# Patient Record
Sex: Male | Born: 1950 | Race: White | Hispanic: No | Marital: Married | Smoking: Current some day smoker
Health system: Southern US, Community
[De-identification: ages and names within clinical notes are randomized; demographics above are authoritative.]

## PROBLEM LIST (undated history)

## (undated) DIAGNOSIS — E785 Hyperlipidemia, unspecified: Secondary | ICD-10-CM

## (undated) DIAGNOSIS — M1990 Unspecified osteoarthritis, unspecified site: Secondary | ICD-10-CM

## (undated) HISTORY — PX: COLONOSCOPY: SHX174

## (undated) HISTORY — PX: APPENDECTOMY: SHX54

## (undated) HISTORY — PX: VASECTOMY: SHX75

---

## 2004-10-03 ENCOUNTER — Ambulatory Visit: Payer: Self-pay | Admitting: Internal Medicine

## 2006-10-14 ENCOUNTER — Ambulatory Visit: Payer: Self-pay | Admitting: Gastroenterology

## 2006-10-14 LAB — CONVERTED CEMR LAB: PSA: 2.08 ng/mL (ref 0.10–4.00)

## 2007-11-17 ENCOUNTER — Encounter: Payer: Self-pay | Admitting: Internal Medicine

## 2008-01-05 ENCOUNTER — Ambulatory Visit: Payer: Self-pay | Admitting: Internal Medicine

## 2008-01-05 LAB — CONVERTED CEMR LAB
Basophils Relative: 0.6 % (ref 0.0–1.0)
Chloride: 99 meq/L (ref 96–112)
Creatinine, Ser: 1.2 mg/dL (ref 0.4–1.5)
Eosinophils Relative: 2.5 % (ref 0.0–5.0)
Glucose, Bld: 101 mg/dL — ABNORMAL HIGH (ref 70–99)
HCT: 43 % (ref 39.0–52.0)
Ketones, ur: NEGATIVE mg/dL
Neutrophils Relative %: 54.2 % (ref 43.0–77.0)
Nitrite: NEGATIVE
PSA: 2.24 ng/mL (ref 0.10–4.00)
RBC: 4.64 M/uL (ref 4.22–5.81)
RDW: 12.5 % (ref 11.5–14.6)
Sodium: 138 meq/L (ref 135–145)
Urobilinogen, UA: 0.2 (ref 0.0–1.0)
WBC: 6.3 10*3/uL (ref 4.5–10.5)
pH: 5 (ref 5.0–8.0)

## 2008-01-06 ENCOUNTER — Encounter: Payer: Self-pay | Admitting: Urology

## 2008-01-06 ENCOUNTER — Encounter: Payer: Self-pay | Admitting: Internal Medicine

## 2008-01-06 ENCOUNTER — Encounter (INDEPENDENT_AMBULATORY_CARE_PROVIDER_SITE_OTHER): Payer: Self-pay | Admitting: *Deleted

## 2008-01-06 ENCOUNTER — Ambulatory Visit (HOSPITAL_BASED_OUTPATIENT_CLINIC_OR_DEPARTMENT_OTHER): Admission: RE | Admit: 2008-01-06 | Discharge: 2008-01-07 | Payer: Self-pay | Admitting: Urology

## 2008-01-31 ENCOUNTER — Encounter: Payer: Self-pay | Admitting: Internal Medicine

## 2008-11-03 HISTORY — PX: TRANSURETHRAL RESECTION OF PROSTATE: SHX73

## 2009-02-07 ENCOUNTER — Ambulatory Visit: Payer: Self-pay | Admitting: Gastroenterology

## 2009-02-07 LAB — CONVERTED CEMR LAB
BUN: 18 mg/dL (ref 6–23)
CO2: 33 meq/L — ABNORMAL HIGH (ref 19–32)
Chloride: 104 meq/L (ref 96–112)
Creatinine, Ser: 1.3 mg/dL (ref 0.4–1.5)
Hemoglobin, Urine: NEGATIVE
Total Protein, Urine: NEGATIVE mg/dL
Urine Glucose: NEGATIVE mg/dL

## 2009-02-08 ENCOUNTER — Ambulatory Visit (HOSPITAL_COMMUNITY): Admission: RE | Admit: 2009-02-08 | Discharge: 2009-02-08 | Payer: Self-pay | Admitting: Gastroenterology

## 2009-05-25 ENCOUNTER — Ambulatory Visit (HOSPITAL_COMMUNITY): Admission: RE | Admit: 2009-05-25 | Discharge: 2009-05-25 | Payer: Self-pay | Admitting: Internal Medicine

## 2009-07-04 ENCOUNTER — Ambulatory Visit: Payer: Self-pay | Admitting: Gastroenterology

## 2009-07-04 LAB — CONVERTED CEMR LAB: BUN: 19 mg/dL (ref 6–23)

## 2009-10-24 ENCOUNTER — Encounter (INDEPENDENT_AMBULATORY_CARE_PROVIDER_SITE_OTHER): Payer: Self-pay | Admitting: *Deleted

## 2009-10-30 ENCOUNTER — Encounter: Payer: Self-pay | Admitting: Gastroenterology

## 2010-03-11 ENCOUNTER — Encounter: Payer: Self-pay | Admitting: Internal Medicine

## 2010-07-18 ENCOUNTER — Encounter (INDEPENDENT_AMBULATORY_CARE_PROVIDER_SITE_OTHER): Payer: Self-pay | Admitting: *Deleted

## 2010-08-07 ENCOUNTER — Ambulatory Visit: Payer: Self-pay | Admitting: Internal Medicine

## 2010-08-10 ENCOUNTER — Encounter: Payer: Self-pay | Admitting: Internal Medicine

## 2010-10-11 ENCOUNTER — Ambulatory Visit: Payer: Self-pay | Admitting: Internal Medicine

## 2010-10-11 ENCOUNTER — Telehealth: Payer: Self-pay | Admitting: Internal Medicine

## 2010-10-11 DIAGNOSIS — R109 Unspecified abdominal pain: Secondary | ICD-10-CM | POA: Insufficient documentation

## 2010-10-11 LAB — CONVERTED CEMR LAB
Albumin: 4.4 g/dL (ref 3.5–5.2)
BUN: 21 mg/dL (ref 6–23)
Basophils Relative: 0.2 % (ref 0.0–3.0)
Bilirubin Urine: NEGATIVE
CO2: 30 meq/L (ref 19–32)
Calcium: 10 mg/dL (ref 8.4–10.5)
Chloride: 103 meq/L (ref 96–112)
Creatinine, Ser: 1.2 mg/dL (ref 0.4–1.5)
Eosinophils Absolute: 0.1 10*3/uL (ref 0.0–0.7)
Eosinophils Relative: 1.1 % (ref 0.0–5.0)
GFR calc non Af Amer: 65.92 mL/min (ref >60.00)
Glucose, Bld: 115 mg/dL — ABNORMAL HIGH (ref 70–99)
Hemoglobin: 13.6 g/dL (ref 13.0–17.0)
Ketones, ur: NEGATIVE mg/dL
Leukocytes, UA: NEGATIVE
Lipase: 26 units/L (ref 11.0–59.0)
Lymphocytes Relative: 17.9 % (ref 12.0–46.0)
MCHC: 34.3 g/dL (ref 30.0–36.0)
Monocytes Relative: 6.7 % (ref 3.0–12.0)
Neutro Abs: 4.9 10*3/uL (ref 1.4–7.7)
Nitrite: NEGATIVE
RBC: 4.32 M/uL (ref 4.22–5.81)
WBC: 6.6 10*3/uL (ref 4.5–10.5)

## 2010-10-24 ENCOUNTER — Ambulatory Visit: Payer: Self-pay | Admitting: Endocrinology

## 2010-10-24 LAB — CONVERTED CEMR LAB: Hgb A1c MFr Bld: 6 % (ref 4.6–6.5)

## 2010-11-03 HISTORY — PX: TOTAL HIP ARTHROPLASTY: SHX124

## 2010-11-06 ENCOUNTER — Encounter: Payer: Self-pay | Admitting: Endocrinology

## 2010-11-06 ENCOUNTER — Ambulatory Visit
Admission: RE | Admit: 2010-11-06 | Discharge: 2010-11-06 | Payer: Self-pay | Source: Home / Self Care | Attending: Endocrinology | Admitting: Endocrinology

## 2010-11-06 DIAGNOSIS — R7309 Other abnormal glucose: Secondary | ICD-10-CM | POA: Insufficient documentation

## 2010-11-12 LAB — CONVERTED CEMR LAB: Pancreatic Islet Cell Antibody: <5 (ref <5)

## 2010-12-05 NOTE — Miscellaneous (Signed)
Summary: Orders Update  Clinical Lists Changes  Problems: Added new problem of HYPERGLYCEMIA (ICD-790.29) Orders: Added new Test order of T-ICA/Pancreatic Islet Cell Elsie Stain [phone]) - Signed Added new Test order of T-Anti-GAD [phone]) - Signed

## 2010-12-05 NOTE — Miscellaneous (Signed)
Summary: Reminder of Colonoscopy  ---- Converted from flag ---- ---- 03/11/2010 10:59 AM, Louis Meckel MD wrote: thanks for the reminder  ---- 03/11/2010 10:48 AM, Harlow Mares CMA Duncan Dull) wrote: Nathaniel Mejia reminder  Hey just an FYI I am to your name on the recall list again and its time for your Colonoscopy you are over due and you know the importance of having your screening colonoscopy. I am sure Dr. Marina Goodell would be more than willing to work you into his schedule.   Nathaniel Mejia

## 2010-12-05 NOTE — Procedures (Signed)
Summary: Colonoscopy  Patient: Nathaniel Mejia Note: All result statuses are Final unless otherwise noted.  Tests: (1) Colonoscopy (COL)   COL Colonoscopy           DONE     [facility]     [address]     [place], [place]  [address]           COLONOSCOPY PROCEDURE REPORT           PATIENT:  Nathaniel, Mejia  MR#:  [id number]     BIRTHDATE:  07-16-1951, 59 yrs. old  GENDER:  male     ENDOSCOPIST:  Wilhemina Bonito. Eda Keys, MD     REF. BY:  Screening Recall     PROCEDURE DATE:  08/07/2010     PROCEDURE:  Colonoscopy with snare polypectomy x 1     ASA CLASS:  Class II     INDICATIONS:  Elevated Risk Screening, family history of colon     cancer ; father @ 29 (last exam 10-2004 negative)     MEDICATIONS:   Fentanyl 100 mcg IV, Versed 10 mg IV, Benadryl 25     mg IV           DESCRIPTION OF PROCEDURE:   After the risks benefits and     alternatives of the procedure were thoroughly explained, informed     consent was obtained.  Digital rectal exam was performed and     revealed no abnormalities, including NORMAL PROSTATE exam.   The     LB 180AL [id number] endoscope was introduced through the anus and     advanced to the cecum, which was identified by both the appendix     and ileocecal valve, without limitations.Time to cecum = 5:41 min.     The quality of the prep was excellent, using Su-prep.  The     instrument was then slowly withdrawn (time = 17:46 min) as the     colon was fully examined.     <<PROCEDUREIMAGES>>           FINDINGS:  The terminal ileum appeared normal.  A diminutive polyp     was found in the proximal transverse colon. Polyp was snared     without cautery. Retrieval was successful.   This was otherwise a     normal examination of the colon.   Retroflexed views in the rectum     revealed small internal hemorrhoids.    The scope was then     withdrawn from the patient and the procedure completed.           COMPLICATIONS:  None           ENDOSCOPIC  IMPRESSION:     1) Normal terminal ileum     2) Diminutive polyp in the proximal transverse colon - removed     3) Otherwise normal examination     4) Internal hemorrhoids     RECOMMENDATIONS:     1) Follow up colonoscopy in 5 years           ______________________________     Wilhemina Bonito. Eda Keys, MD           CC:  Melvia Heaps, MD           n.     Rosalie Doctor:   Wilhemina Bonito. Eda Keys at 08/07/2010 02:53 PM           Melvia Heaps, [id number]  Note: An exclamation mark (!) indicates  a result that was not dispersed into the flowsheet. Document Creation Date: 08/07/2010 2:54 PM _______________________________________________________________________  (1) Order result status: Final Collection or observation date-time: 08/07/2010 14:43 Requested date-time:  Receipt date-time:  Reported date-time:  Referring Physician:   Ordering Physician: Fransico Setters [phone]) Specimen Source:  Source: Launa Grill Order Number: [id number] Lab site:   Appended Document: Colonoscopy recall 5 yrs     Procedures Next Due Date:    Colonoscopy: 08/2015

## 2010-12-05 NOTE — Progress Notes (Signed)
Summary: phone note: Acute abdominal pain  Phone Note Call from Patient   Caller: Patient Call For: Dr. Marina Goodell Details for Reason: abdominal pain Summary of Call: Nathaniel Mejia called this morning at 7:30 AM with new-onset acute abdominal pain. Some diaphoresis. No vomiting, change of bowel habits, or urinary symptoms. Took one dose of PPI and antispasmodic without relief. Pain rated 8/10. Subsequent gradual improvement and then he took a Vicodin. Pain around 2/10 at 8:20 AM. We will plan to have him come in for CBC, comprehensive metabolic panel, amylase, lipase, and urinalysis. As well, CT scan. Initial call taken by: Hilarie Fredrickson MD,  October 11, 2010 9:08 AM  Follow-up for Phone Call        Patient aware of CT scan scheduled for this am at 11:30.  He is instrcted to begin drinking his contrast first bottle at 9:30 and the second at 10:30.  Patient is advised to come for lab work also. Follow-up by: Darcey Nora RN, CGRN,  October 11, 2010 9:47 AM  New Problems: ABDOMINAL PAIN, ACUTE (ICD-789.00)   New Problems: ABDOMINAL PAIN, ACUTE (ICD-789.00)

## 2010-12-05 NOTE — Letter (Signed)
Summary: [facility] Instructions  [facility]  [address] [place], [place] [address]   Phone: [phone]  Fax: [phone]       Nathaniel Mejia    [birth year]    MRN: [id number]        Procedure Day Dorna Bloom:  Wednesday 08/07/2010     Arrival Time: 12:00 pm      Procedure Time: 1:00 pm     Location of Procedure:                    _ x_  [facility] (4th Floor)                        PREPARATION FOR COLONOSCOPY WITH MOVIPREP   Starting 5 days prior to your procedure 08/02/2010 do not eat nuts, seeds, popcorn, corn, beans, peas,  salads, or any raw vegetables.  Do not take any fiber supplements (e.g. Metamucil, Citrucel, and Benefiber).  THE DAY BEFORE YOUR PROCEDURE         DATE: Tuesday 10/4  1.  Drink clear liquids the entire day-NO SOLID FOOD  2.  Do not drink anything colored red or purple.  Avoid juices with pulp.  No orange juice.  3.  Drink at least 64 oz. (8 glasses) of fluid/clear liquids during the day to prevent dehydration and help the prep work efficiently.  CLEAR LIQUIDS INCLUDE: Water Jello Ice Popsicles Tea (sugar ok, no milk/cream) Powdered fruit flavored drinks Coffee (sugar ok, no milk/cream) Gatorade Juice: apple, white grape, white cranberry  Lemonade Clear bullion, consomm, broth Carbonated beverages (any kind) Strained chicken noodle soup Hard Candy                             4.  In the morning, mix first dose of MoviPrep solution:    Empty 1 Pouch A and 1 Pouch B into the disposable container    Add lukewarm drinking water to the top line of the container. Mix to dissolve    Refrigerate (mixed solution should be used within 24 hrs)  5.  Begin drinking the prep at 5:00 p.m. The MoviPrep container is divided by 4 marks.   Every 15 minutes drink the solution down to the next mark (approximately 8 oz) until the full liter is complete.   6.  Follow completed prep with 16 oz of clear liquid of your choice (Nothing  red or purple).  Continue to drink clear liquids until bedtime.  7.  Before going to bed, mix second dose of MoviPrep solution:    Empty 1 Pouch A and 1 Pouch B into the disposable container    Add lukewarm drinking water to the top line of the container. Mix to dissolve    Refrigerate  THE DAY OF YOUR PROCEDURE      DATE: Wednesday 10/5  Beginning at 8:00 a.m. (5 hours before procedure):         1. Every 15 minutes, drink the solution down to the next mark (approx 8 oz) until the full liter is complete.  2. Follow completed prep with 16 oz. of clear liquid of your choice.    3. You may drink clear liquids until 11:00 am (2 HOURS BEFORE PROCEDURE).   MEDICATION INSTRUCTIONS  Unless otherwise instructed, you should take regular prescription medications with a small sip of water   as early as possible the morning of your  procedure.           OTHER INSTRUCTIONS  You will need a responsible adult at least 60 years of age to accompany you and drive you home.   This person must remain in the waiting room during your procedure.  Wear loose fitting clothing that is easily removed.  Leave jewelry and other valuables at home.  However, you may wish to bring a book to read or  an iPod/MP3 player to listen to music as you wait for your procedure to start.  Remove all body piercing jewelry and leave at home.  Total time from sign-in until discharge is approximately 2-3 hours.  You should go home directly after your procedure and rest.  You can resume normal activities the  day after your procedure.  The day of your procedure you should not:   Drive   Make legal decisions   Operate machinery   Drink alcohol   Return to work  You will receive specific instructions about eating, activities and medications before you leave.    The above instructions have been reviewed and explained to me by   Ezra Sites RN  July 18, 2010 3:13 PM     I fully understand and  can verbalize these instructions _____________________________ Date _________

## 2010-12-05 NOTE — Letter (Signed)
Summary: Patient Notice- Polyp Results  [facility]  [address] [place], [place] [address]   Phone: [phone]  Fax: [phone]        August 10, 2010 MRN: [id number]    Melvia Heaps, MD [address] [place], [place]  [address]    Dear Dr. Arlyce Dice,  I am pleased to inform you that the colon polyp removed during your recent colonoscopy was a benign (no cancer detected)tubular adenoma upon pathologic examination.  I recommend you have a repeat colonoscopy examination in 5 years to look for recurrent polyps, as having colon polyps increases your risk for having recurrent polyps or even colon cancer in the future.  Should you develop new or worsening symptoms of abdominal pain, bowel habit changes or bleeding from the rectum or bowels, please schedule an evaluation with either your primary care physician or with me.     Additional information/recommendations:   __ No further action with gastroenterology is needed at this time. Please      follow-up with your primary care physician for your other healthcare      needs.     Please call us if you are having persistent problems or have questions about your condition that have not been fully answered at this time.  Sincerely,  Roxy Cedar MD  This letter has been electronically signed by your physician.  Appended Document: Patient Notice- Polyp Results letter mailed

## 2011-01-30 ENCOUNTER — Encounter: Payer: Self-pay | Admitting: Endocrinology

## 2011-01-30 LAB — HEMOGLOBIN A1C: Mean Plasma Glucose: 126 mg/dL — ABNORMAL HIGH (ref <117)

## 2011-01-30 LAB — CONVERTED CEMR LAB: Hgb A1c MFr Bld: 6 % — ABNORMAL HIGH (ref <5.7)

## 2011-02-11 ENCOUNTER — Other Ambulatory Visit: Payer: Self-pay | Admitting: *Deleted

## 2011-02-11 MED ORDER — SERTRALINE HCL 100 MG PO TABS: ORAL_TABLET | ORAL | Status: DC

## 2011-02-11 MED ORDER — ATORVASTATIN CALCIUM 20 MG PO TABS: 20.0000 mg | ORAL_TABLET | Freq: Every day | ORAL | Status: DC

## 2011-02-11 NOTE — Telephone Encounter (Signed)
Per Dr [name], refill patient zoloft and lipitor. Rx sent to [facility] as per patient request.

## 2011-02-14 ENCOUNTER — Other Ambulatory Visit: Payer: Self-pay | Admitting: Endocrinology

## 2011-02-14 MED ORDER — ATORVASTATIN CALCIUM 20 MG PO TABS: 20.0000 mg | ORAL_TABLET | Freq: Every day | ORAL | Status: DC

## 2011-02-14 MED ORDER — SERTRALINE HCL 100 MG PO TABS: ORAL_TABLET | ORAL | Status: DC

## 2011-03-18 NOTE — Op Note (Signed)
NAMEHOMER, MILLER               ACCOUNT NO.:  [id number]   MEDICAL RECORD NO.:  [id number]          PATIENT TYPE:  AMB   LOCATION:  [facility]                         FACILITY:  [facility]   PHYSICIAN:  Bertram Millard. Dahlstedt, M.D.DATE OF BIRTH:  [birth year]   DATE OF PROCEDURE:  DATE OF DISCHARGE:                               OPERATIVE REPORT   PREOPERATIVE DIAGNOSIS:  Benign prostatic hypertrophy.   POSTOPERATIVE DIAGNOSIS:  Benign prostatic hypertrophy.   PROCEDURE:  TURP using Gyrus device.   ANESTHESIA:  General LMA.   COMPLICATIONS:  None.   SPECIMENS:  Prostate chips to pathology.   BRIEF HISTORY:  Cleone Slim is a 60 year old who I have been seeing  intermittently with lower urinary tract symptoms.  He has significant  symptoms of slowing of the stream, urgency and frequency.  He has been  on Uroxatral.  Despite this, he has significant symptomatology.  We  talked intermittently about intervention with surgery.  He is interested  in minimally invasive therapy, i.e., laser.  I recommended that we  proceed with an anesthetic procedure, and I have recommended the Gyrus  device.  This offers the same benefits as using the green light laser,  but has a cleaner effect.  It can also be used with traditional  equipment.  Rob understands the procedure and as well as the risks and  complications.  He understands these and desires to proceed.   DESCRIPTION OF PROCEDURE:  The patient was identified in the holding  area, and received preoperative IV antibiotics.  He was taken to the  operating room where general anesthetic was administered using LMA.  He  was placed in the dorsal lithotomy.  Genitalia and perineum were prepped  and draped.  Urethral meatus was calibrated to 32-French with Sissy Hoff  sounds.  The 28-French resectoscope sheath was placed.  The resectoscope  element with the Gyrus mushroom tip was then placed.  The bladder was  inspected and found be normal except for 1-2+  trabeculations.  No  foreign bodies were seen.  The ureteral orifices were identified, and  were a fair distance from the bladder neck.  There was a fair median  lobe as well as some anterior significant anterior obstructing tissue.  Using the Gyrus from the bladder neck to the verumontanum, I left the  verumontanum intact to use as a landmark for distal resection.  This  resection/vaporization of the posterior median lobe was done deep enough  to get to the circular muscular fibers.  I then turned the scope  anteriorly and vaporized the anterior tissue from the bladder neck all  the way to the area of the verumontanum/apex of the prostate.  Again,  vaporization was taken down to the circular fibers.  I then used the  same procedure to vaporize the left lateral lobe.  I aggressively  resected down, again, to the circular fibers/surgical capsule.  With the  right lobe, I used the cutting loop to obtain approximately 5-6 chips of  tissue.  These will be sent for pathology.  They were allowed to float  into the prostate for later  evacuation.  I then replaced the cutting  loop with the mushroom tip.  The right lobe was then vaporized down to  the fibers/surgical capsule.  I then used the same mushroom tip to  cauterize all of the all of the resected prostate circumferentially.  Once this was done, I incised the bladder neck in the midline with the  mushroom tip.  This allowed the bladder neck to open quite nicely.  Care  was taken to identify the ureteral orifices before I did this - they  were well away from the bladder neck area.  The mushroom tip was then  used to resect the bladder neck circumferentially.  I then turned the  irrigation off.  No active bleeding was seen.  At this point the chips  were evacuated from the bladder and sent as prostate chips.  They were  sent to pathology.  The bladder was drained, the scope removed and then  a Foley catheter inserted - a 20-French Foley  catheter was used with 20  mL of saline placed in the balloon.  This was hooked to dependent  drainage and secured to the patient's right thigh.   The patient tolerated procedure well.  He was awakened and taken to the  PACU in stable condition.      Bertram Millard. Dahlstedt, M.D.  Electronically Signed     SMD/MEDQ  D:  01/06/2008  T:  01/06/2008  Job:  [id number]   cc:   Titus Dubin. Alwyn Ren, MD,FACP,FCCP  [id number] [place]  [place] [address]

## 2011-04-29 ENCOUNTER — Other Ambulatory Visit: Payer: Self-pay | Admitting: Endocrinology

## 2011-04-29 MED ORDER — CELECOXIB 200 MG PO CAPS: 200.0000 mg | ORAL_CAPSULE | Freq: Every day | ORAL | Status: DC

## 2011-05-12 ENCOUNTER — Other Ambulatory Visit: Payer: Self-pay

## 2011-05-12 MED ORDER — SERTRALINE HCL 100 MG PO TABS: ORAL_TABLET | ORAL | Status: DC

## 2011-05-20 ENCOUNTER — Other Ambulatory Visit (INDEPENDENT_AMBULATORY_CARE_PROVIDER_SITE_OTHER): Payer: Self-pay

## 2011-05-20 ENCOUNTER — Telehealth: Payer: Self-pay | Admitting: Endocrinology

## 2011-05-20 ENCOUNTER — Telehealth: Payer: Self-pay | Admitting: *Deleted

## 2011-05-20 DIAGNOSIS — M791 Myalgia, unspecified site: Secondary | ICD-10-CM

## 2011-05-20 DIAGNOSIS — IMO0001 Reserved for inherently not codable concepts without codable children: Secondary | ICD-10-CM

## 2011-05-20 LAB — COMPREHENSIVE METABOLIC PANEL
ALT: 23 U/L (ref 0–53)
AST: 25 U/L (ref 0–37)
Alkaline Phosphatase: 68 U/L (ref 39–117)
BUN: 30 mg/dL — ABNORMAL HIGH (ref 6–23)
Creatinine, Ser: 1.3 mg/dL (ref 0.4–1.5)
Potassium: 5.3 mEq/L — ABNORMAL HIGH (ref 3.5–5.1)

## 2011-05-20 LAB — CK: Total CK: 94 U/L (ref 7–232)

## 2011-05-20 LAB — SEDIMENTATION RATE: Sed Rate: 17 mm/hr (ref 0–22)

## 2011-05-20 MED ORDER — PREDNISONE 5 MG PO TABS: ORAL_TABLET | ORAL | Status: DC

## 2011-05-20 MED ORDER — METAXALONE 800 MG PO TABS: 800.0000 mg | ORAL_TABLET | Freq: Three times a day (TID) | ORAL | Status: DC

## 2011-05-20 NOTE — Telephone Encounter (Signed)
Lab called requesting change of lab order to future

## 2011-05-20 NOTE — Telephone Encounter (Signed)
Pt reports he recently saw ortho for back sxs, but he is unable to reach that dr now,  He reports ongoing myalgias of the legs, and assoc weakness. i refilled meds, and ordered labs

## 2011-05-27 ENCOUNTER — Other Ambulatory Visit (HOSPITAL_COMMUNITY): Payer: Self-pay | Admitting: Orthopedic Surgery

## 2011-05-27 ENCOUNTER — Ambulatory Visit (HOSPITAL_COMMUNITY)
Admission: RE | Admit: 2011-05-27 | Discharge: 2011-05-27 | Disposition: A | Discharge status: Home / Self Care | Payer: 59 | Source: Ambulatory Visit | Attending: Orthopedic Surgery | Admitting: Orthopedic Surgery

## 2011-05-27 DIAGNOSIS — M47817 Spondylosis without myelopathy or radiculopathy, lumbosacral region: Secondary | ICD-10-CM | POA: Insufficient documentation

## 2011-05-27 DIAGNOSIS — M79609 Pain in unspecified limb: Secondary | ICD-10-CM | POA: Insufficient documentation

## 2011-05-27 DIAGNOSIS — M5137 Other intervertebral disc degeneration, lumbosacral region: Secondary | ICD-10-CM | POA: Insufficient documentation

## 2011-05-27 DIAGNOSIS — M545 Low back pain, unspecified: Secondary | ICD-10-CM

## 2011-05-27 DIAGNOSIS — M51379 Other intervertebral disc degeneration, lumbosacral region without mention of lumbar back pain or lower extremity pain: Secondary | ICD-10-CM | POA: Insufficient documentation

## 2011-05-27 DIAGNOSIS — M48061 Spinal stenosis, lumbar region without neurogenic claudication: Secondary | ICD-10-CM | POA: Insufficient documentation

## 2011-06-02 ENCOUNTER — Other Ambulatory Visit (INDEPENDENT_AMBULATORY_CARE_PROVIDER_SITE_OTHER): Payer: Commercial Managed Care - PPO

## 2011-06-02 ENCOUNTER — Other Ambulatory Visit: Payer: Self-pay | Admitting: *Deleted

## 2011-06-02 DIAGNOSIS — R319 Hematuria, unspecified: Secondary | ICD-10-CM

## 2011-06-02 LAB — BASIC METABOLIC PANEL
BUN: 23 mg/dL (ref 6–23)
Chloride: 105 mEq/L (ref 96–112)
Creatinine, Ser: 1.1 mg/dL (ref 0.4–1.5)
Glucose, Bld: 91 mg/dL (ref 70–99)
Potassium: 4.1 mEq/L (ref 3.5–5.1)

## 2011-06-02 LAB — URINALYSIS, ROUTINE W REFLEX MICROSCOPIC
Bilirubin Urine: NEGATIVE
Ketones, ur: NEGATIVE
Leukocytes, UA: NEGATIVE
Nitrite: NEGATIVE
Specific Gravity, Urine: 1.02 (ref 1.000–1.030)
Urobilinogen, UA: 0.2 (ref 0.0–1.0)
pH: 6 (ref 5.0–8.0)

## 2011-06-04 ENCOUNTER — Other Ambulatory Visit (HOSPITAL_COMMUNITY): Payer: Self-pay | Admitting: Orthopedic Surgery

## 2011-06-04 ENCOUNTER — Ambulatory Visit (HOSPITAL_COMMUNITY): Payer: Commercial Managed Care - PPO

## 2011-06-04 ENCOUNTER — Ambulatory Visit
Admission: RE | Admit: 2011-06-04 | Discharge: 2011-06-04 | Disposition: A | Discharge status: Home / Self Care | Payer: Commercial Managed Care - PPO | Source: Ambulatory Visit | Attending: Orthopedic Surgery | Admitting: Orthopedic Surgery

## 2011-06-04 ENCOUNTER — Ambulatory Visit (HOSPITAL_COMMUNITY)
Admission: RE | Admit: 2011-06-04 | Discharge: 2011-06-04 | Disposition: A | Discharge status: Home / Self Care | Payer: 59 | Source: Ambulatory Visit | Attending: Orthopedic Surgery | Admitting: Orthopedic Surgery

## 2011-06-04 DIAGNOSIS — M47817 Spondylosis without myelopathy or radiculopathy, lumbosacral region: Secondary | ICD-10-CM | POA: Insufficient documentation

## 2011-06-04 DIAGNOSIS — M545 Low back pain: Secondary | ICD-10-CM

## 2011-06-04 MED ORDER — IOHEXOL 180 MG/ML  SOLN
20.0000 mL | Freq: Once | INTRAMUSCULAR | Status: AC | PRN
  Administered 2011-06-04: 5 mL

## 2011-06-16 ENCOUNTER — Other Ambulatory Visit (HOSPITAL_COMMUNITY): Payer: Self-pay | Admitting: Orthopedic Surgery

## 2011-06-16 ENCOUNTER — Ambulatory Visit (HOSPITAL_COMMUNITY)
Admission: RE | Admit: 2011-06-16 | Discharge: 2011-06-16 | Disposition: A | Discharge status: Home / Self Care | Payer: 59 | Source: Ambulatory Visit | Attending: Orthopedic Surgery | Admitting: Orthopedic Surgery

## 2011-06-16 DIAGNOSIS — M47817 Spondylosis without myelopathy or radiculopathy, lumbosacral region: Secondary | ICD-10-CM | POA: Insufficient documentation

## 2011-06-16 MED ORDER — IOHEXOL 180 MG/ML  SOLN: 20.0000 mL | Freq: Once | INTRAMUSCULAR | Status: AC | PRN

## 2011-06-18 ENCOUNTER — Other Ambulatory Visit: Payer: Self-pay | Admitting: Orthopedic Surgery

## 2011-06-18 DIAGNOSIS — M25559 Pain in unspecified hip: Secondary | ICD-10-CM

## 2011-06-19 ENCOUNTER — Inpatient Hospital Stay: Admission: RE | Admit: 2011-06-19 | Payer: Commercial Managed Care - PPO | Source: Ambulatory Visit

## 2011-06-20 ENCOUNTER — Other Ambulatory Visit: Payer: Commercial Managed Care - PPO

## 2011-06-26 ENCOUNTER — Ambulatory Visit
Admission: RE | Admit: 2011-06-26 | Discharge: 2011-06-26 | Disposition: A | Discharge status: Home / Self Care | Payer: Commercial Managed Care - PPO | Source: Ambulatory Visit | Attending: Orthopedic Surgery | Admitting: Orthopedic Surgery

## 2011-06-26 ENCOUNTER — Other Ambulatory Visit: Payer: Self-pay | Admitting: Neurological Surgery

## 2011-06-26 DIAGNOSIS — M25559 Pain in unspecified hip: Secondary | ICD-10-CM

## 2011-06-26 MED ORDER — IOHEXOL 180 MG/ML  SOLN
1.0000 mL | Freq: Once | INTRAMUSCULAR | Status: AC | PRN
  Administered 2011-06-26: 1 mL via EPIDURAL

## 2011-06-26 MED ORDER — METHYLPREDNISOLONE ACETATE 40 MG/ML INJ SUSP (RADIOLOG
120.0000 mg | Freq: Once | INTRAMUSCULAR | Status: AC
  Administered 2011-06-26: 120 mg via EPIDURAL

## 2011-07-14 ENCOUNTER — Other Ambulatory Visit: Payer: Self-pay | Admitting: Orthopedic Surgery

## 2011-07-14 DIAGNOSIS — M25551 Pain in right hip: Secondary | ICD-10-CM

## 2011-07-15 ENCOUNTER — Ambulatory Visit
Admission: RE | Admit: 2011-07-15 | Discharge: 2011-07-15 | Disposition: A | Discharge status: Home / Self Care | Payer: Commercial Managed Care - PPO | Source: Ambulatory Visit | Attending: Orthopedic Surgery | Admitting: Orthopedic Surgery

## 2011-07-15 DIAGNOSIS — M25551 Pain in right hip: Secondary | ICD-10-CM

## 2011-07-15 MED ORDER — IOHEXOL 180 MG/ML  SOLN
1.0000 mL | Freq: Once | INTRAMUSCULAR | Status: AC | PRN
  Administered 2011-07-15: 1 mL via INTRA_ARTICULAR

## 2011-07-15 MED ORDER — METHYLPREDNISOLONE ACETATE 40 MG/ML INJ SUSP (RADIOLOG
120.0000 mg | Freq: Once | INTRAMUSCULAR | Status: AC
  Administered 2011-07-15: 120 mg via INTRA_ARTICULAR

## 2011-07-16 ENCOUNTER — Emergency Department (HOSPITAL_COMMUNITY)
Admission: EM | Admit: 2011-07-16 | Discharge: 2011-07-16 | Disposition: A | Discharge status: Home / Self Care | Payer: Commercial Managed Care - PPO | Attending: Emergency Medicine | Admitting: Emergency Medicine

## 2011-07-16 DIAGNOSIS — E7800 Pure hypercholesterolemia, unspecified: Secondary | ICD-10-CM | POA: Insufficient documentation

## 2011-07-16 DIAGNOSIS — Z79899 Other long term (current) drug therapy: Secondary | ICD-10-CM | POA: Insufficient documentation

## 2011-07-16 DIAGNOSIS — M25559 Pain in unspecified hip: Secondary | ICD-10-CM | POA: Insufficient documentation

## 2011-07-16 DIAGNOSIS — F329 Major depressive disorder, single episode, unspecified: Secondary | ICD-10-CM | POA: Insufficient documentation

## 2011-07-16 DIAGNOSIS — Z9889 Other specified postprocedural states: Secondary | ICD-10-CM | POA: Insufficient documentation

## 2011-07-16 DIAGNOSIS — F3289 Other specified depressive episodes: Secondary | ICD-10-CM | POA: Insufficient documentation

## 2011-07-16 LAB — CBC
HCT: 41 % (ref 39.0–52.0)
MCH: 30.8 pg (ref 26.0–34.0)
MCHC: 34.1 g/dL (ref 30.0–36.0)
MCV: 90.3 fL (ref 78.0–100.0)
Platelets: 253 10*3/uL (ref 150–400)
RDW: 12.6 % (ref 11.5–15.5)

## 2011-07-16 LAB — BASIC METABOLIC PANEL
BUN: 26 mg/dL — ABNORMAL HIGH (ref 6–23)
Calcium: 10.8 mg/dL — ABNORMAL HIGH (ref 8.4–10.5)
Creatinine, Ser: 1.26 mg/dL (ref 0.50–1.35)
GFR calc Af Amer: >60 mL/min (ref >60)
GFR calc non Af Amer: 59 mL/min — ABNORMAL LOW (ref >60)

## 2011-07-21 ENCOUNTER — Other Ambulatory Visit (HOSPITAL_COMMUNITY): Payer: Self-pay | Admitting: Orthopedic Surgery

## 2011-07-21 DIAGNOSIS — M25551 Pain in right hip: Secondary | ICD-10-CM

## 2011-07-22 ENCOUNTER — Ambulatory Visit (HOSPITAL_COMMUNITY)
Admission: RE | Admit: 2011-07-22 | Discharge: 2011-07-22 | Disposition: A | Discharge status: Home / Self Care | Payer: Commercial Managed Care - PPO | Source: Ambulatory Visit | Attending: Orthopedic Surgery | Admitting: Orthopedic Surgery

## 2011-07-22 DIAGNOSIS — M1610 Unilateral primary osteoarthritis, unspecified hip: Secondary | ICD-10-CM | POA: Insufficient documentation

## 2011-07-22 DIAGNOSIS — M25551 Pain in right hip: Secondary | ICD-10-CM

## 2011-07-22 DIAGNOSIS — M169 Osteoarthritis of hip, unspecified: Secondary | ICD-10-CM | POA: Insufficient documentation

## 2011-07-22 DIAGNOSIS — M25559 Pain in unspecified hip: Secondary | ICD-10-CM | POA: Insufficient documentation

## 2011-09-09 ENCOUNTER — Ambulatory Visit (INDEPENDENT_AMBULATORY_CARE_PROVIDER_SITE_OTHER): Payer: 59 | Admitting: Internal Medicine

## 2011-09-09 ENCOUNTER — Encounter: Payer: Self-pay | Admitting: Internal Medicine

## 2011-09-09 VITALS — BP 124/80 | HR 54 | Temp 97.8°F | Resp 12 | Ht 72.0 in | Wt 179.4 lb

## 2011-09-09 DIAGNOSIS — R7309 Other abnormal glucose: Secondary | ICD-10-CM

## 2011-09-09 DIAGNOSIS — Z01818 Encounter for other preprocedural examination: Secondary | ICD-10-CM

## 2011-09-09 DIAGNOSIS — E785 Hyperlipidemia, unspecified: Secondary | ICD-10-CM

## 2011-09-09 NOTE — Progress Notes (Signed)
Subjective:    Patient ID: Nathaniel Mejia, male    DOB: 09-10-1951, 60 y.o.   MRN: [id number]  HPI Pre op evaluation: Surgical diagnosis:advanced DJD Tentative surgical date/Surgeon: 10/21/2011 for  bilateral THA w/wo autograph/allograft, Anterior Approach/ Dr Lajoyce Corners Severity: up to 10 on occasion, typically up to 8 Course: R hip initially but subsequently in L as well  Activity of daily living limitation/impairment of function:he could not stand for procedures in GI Lab  & limping Treatment to date, efficacy: S/P 3 ESI injections; nerve root steroid injection;R hip intra -articular steroids X 1  Significant past medical history: no Diabetes but FBS elevated on steroids with A1c of 6 %. A nonfasting glucose was 191 on 07/16/2011. This was performed in the emergency room while on IV D5W. This was in context of intractable hip pain following injection. Nerve block was immediately  beneficial PMH of   Dyslipidemia;on Lipitor X 10 years. No PMH of  Hypertension,coronary disease, or cancer . :     Review of Systems  He had fasting labs done at [facility] @ Doctor[facility] Day; these will be reviewed if possible. He denies myalgias or other adverse effects from the statin.  He exercises at a high cardiac level without symptoms; he gets his heart rate to 170 on stationary bike & ellipitical machine  He has no persistent cough or dyspnea.  Objective:   Physical Exam Gen.: Thin but healthy and  well-nourished in appearance. Alert, appropriate and cooperative throughout exam. Head: Normocephalic without obvious abnormalities. Beard & moustache.  No  alopecia  Eyes: No corneal or conjunctival inflammation noted. Pupils equal round reactive to light and accommodation. Fundal exam is benign without hemorrhages, exudate, papilledema.  Ears: External  ear exam reveals no significant lesions or deformities. Canals clear .TMs normal. Hearing is grossly normal bilaterally. Nose: External nasal exam reveals no deformity or inflammation. Nasal mucosa are pink and moist. No lesions or exudates noted. Septum not deviated  Mouth: Oral mucosa and oropharynx reveal no lesions or exudates. Teeth in good repair. Neck: No deformities, masses, or tenderness noted. Range of motion excellent Thyroid normal Lungs: Normal respiratory effort; chest expands symmetrically. Lungs are clear to auscultation without rales, wheezes, or increased work of breathing. Heart: Normal rate and rhythm. accentuated S1 ; normal S2. No gallop, click, or rub. No murmur. Abdomen: Bowel sounds normal; abdomen soft and nontender. No masses, organomegaly or hernias noted. Genitalia: Dr Retta Diones   .                                                                                   Musculoskeletal/extremities: Increased AP chest diameter. No clubbing, cyanosis, edema, or deformity noted. Range of motion limited @ hips due to pain .Tone & strength  normal.Joints normal. Nail health  good. Vascular: Carotid, radial artery, dorsalis pedis and  posterior tibial pulses are full and equal. No bruits present. Neurologic: Alert and oriented x3. Deep tendon reflexes symmetrical and normal.          Skin: Intact without suspicious lesions or rashes. Lymph: No cervical, axillary lymphadenopathy present. Psych: Mood and affect are normal. 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 By history HDL has been markedly elevated which should be protective.  #3 hyperglycemia, recent in the context of steroid therapy of #1. A1c is  not elevated  Plan: No contraindication to the tentative surgery. Glucose monitor is appropriate; no intervention is expected to be needed  EKG reveals a sinus bradycardia. This is expected with his high level cardiovascular exercise. Early repolarization changes but  no ischemic changes are present.

## 2011-09-09 NOTE — Patient Instructions (Signed)
Risk of premature heart attack or stroke increases as LDL or BAD cholesterol rises.Advanced cholesterol panels optimally determine risk based on particle composition ( NMR Lipoprofile ) or by assessing multiple other genetic risks(Boston Heart Panel 1304X). These are indicated when LDL is > 130, especially if there is family history of heart attack in males before 75 or women before 47

## 2011-09-15 ENCOUNTER — Other Ambulatory Visit: Payer: Self-pay | Admitting: Internal Medicine

## 2011-09-15 ENCOUNTER — Other Ambulatory Visit (INDEPENDENT_AMBULATORY_CARE_PROVIDER_SITE_OTHER): Payer: 59

## 2011-09-15 DIAGNOSIS — R7309 Other abnormal glucose: Secondary | ICD-10-CM

## 2011-09-16 LAB — HEMOGLOBIN A1C: Hgb A1c MFr Bld: 6 % (ref 4.6–6.5)

## 2011-10-15 ENCOUNTER — Encounter (HOSPITAL_COMMUNITY): Payer: Self-pay | Admitting: Pharmacy Technician

## 2011-10-17 ENCOUNTER — Encounter (HOSPITAL_COMMUNITY)
Admission: RE | Admit: 2011-10-17 | Discharge: 2011-10-17 | Disposition: A | Discharge status: Home / Self Care | Payer: 59 | Source: Ambulatory Visit | Attending: Orthopedic Surgery | Admitting: Orthopedic Surgery

## 2011-10-17 ENCOUNTER — Encounter (HOSPITAL_COMMUNITY): Payer: Self-pay

## 2011-10-17 HISTORY — DX: Unspecified osteoarthritis, unspecified site: M19.90

## 2011-10-17 HISTORY — DX: Hyperlipidemia, unspecified: E78.5

## 2011-10-17 LAB — URINALYSIS, ROUTINE W REFLEX MICROSCOPIC
Bilirubin Urine: NEGATIVE
Glucose, UA: NEGATIVE mg/dL
Hgb urine dipstick: NEGATIVE
Specific Gravity, Urine: 1.025 (ref 1.005–1.030)
Urobilinogen, UA: 0.2 mg/dL (ref 0.0–1.0)

## 2011-10-17 LAB — BASIC METABOLIC PANEL
CO2: 32 mEq/L (ref 19–32)
Calcium: 10.9 mg/dL — ABNORMAL HIGH (ref 8.4–10.5)
GFR calc non Af Amer: 59 mL/min — ABNORMAL LOW (ref >90)
Sodium: 140 mEq/L (ref 135–145)

## 2011-10-17 LAB — DIFFERENTIAL
Lymphocytes Relative: 34 % (ref 12–46)
Lymphs Abs: 2.4 10*3/uL (ref 0.7–4.0)
Neutro Abs: 3.8 10*3/uL (ref 1.7–7.7)
Neutrophils Relative %: 55 % (ref 43–77)

## 2011-10-17 LAB — CBC
Platelets: 271 10*3/uL (ref 150–400)
RBC: 4.45 MIL/uL (ref 4.22–5.81)
WBC: 7 10*3/uL (ref 4.0–10.5)

## 2011-10-17 LAB — SURGICAL PCR SCREEN: MRSA, PCR: NEGATIVE

## 2011-10-17 LAB — URINE MICROSCOPIC-ADD ON

## 2011-10-17 LAB — PROTIME-INR: Prothrombin Time: 13.6 seconds (ref 11.6–15.2)

## 2011-10-17 LAB — APTT: aPTT: 32 seconds (ref 24–37)

## 2011-10-17 MED ORDER — CHLORHEXIDINE GLUCONATE 4 % EX LIQD: 60.0000 mL | Freq: Once | CUTANEOUS | Status: DC

## 2011-10-17 NOTE — Patient Instructions (Signed)
20 Nathaniel Mejia  10/17/2011   Your procedure is scheduled on: Tuesday 12/18 at 8:45 am  Report to [facility] at 6:30 AM.  Call this number if you have problems the morning of surgery: [phone]   Remember:   Do not eat food or drink anything after midnight the night before your surgery    Take these medicines the morning of surgery with A SIP OF WATER: lipitor and zofloft   Do not wear jewelry, make-up or nail polish.  Do not wear lotions, powders, or perfumes. You may wear deodorant.  Do not shave 48 hours prior to surgery.  Do not bring valuables to the hospital.  Contacts, dentures or bridgework may not be worn into surgery.  Leave suitcase in the car. After surgery it may be brought to your room.  For patients admitted to the hospital, checkout time is 11:00 AM the day of discharge.   Patients discharged the day of surgery will not be allowed to drive home.    Special Instructions: CHG Shower Use Special Wash: 1/2 bottle night before surgery and 1/2 bottle morning of surgery.   Please read over the following fact sheets that you were given: Blood Transfusion Information and MRSA Information And incentive spirometer instructions

## 2011-10-17 NOTE — Pre-Procedure Instructions (Signed)
ekg report on chart -from dr. Alwyn Ren --done 09/09/11 cxr not needed preop - per anesthesiologist's guidelines

## 2011-10-19 NOTE — H&P (Signed)
Nathaniel Mejia is an 60 y.o. male.    Chief Complaint: bilateral Hip OA and pain   HPI: Pt is a 60 y.o. male complaining of bilateral hip pain for about a year. Pain had continually increased since the beginning. He originally thought the pain was coming from his back, after being worked up for back issues the pain eventually settled into the hips. X-rays in the clinic show end-stage arthritic changes of bilateral hips. Pt has tried various conservative treatments which have failed to alleviate their symptoms. Various options are discussed with the patient. Risks, benefits and expectations were discussed with the patient. Patient understand the risks, benefits and expectations and wishes to proceed with surgery.   PCP:  No primary provider on file.  D/C Plans: Home with HHPT  Post-op Meds:  Rx given for ASA, Robaxin, Celebrex, Iron, Colace and MiraLax  Tranexamic Acid: To be given  PMH: Past Medical History  Diagnosis Date  . Arthritis     bilateral hip oa and pain  . Hyperlipidemia     PSH: Past Surgical History  Procedure Date  . Transurethral resection of prostate 2010    Dr Retta Diones  . Appendectomy   . Vasectomy   . Colonoscopy     adenomatous polyp 2012    Social History:  reports that he has been smoking Pipe.  He does not have any smokeless tobacco history on file. He reports that he drinks alcohol. He reports that he does not use illicit drugs.  Allergies:  No Known Allergies  Medications: No current facility-administered medications on file as of .   Medications Prior to Admission  Medication Sig Dispense Refill  . atorvastatin (LIPITOR) 20 MG tablet Take 20 mg by mouth every morning.       . sertraline (ZOLOFT) 100 MG tablet Take 150 mg by mouth every morning. Take 1 1/2  tablets by mouth once daily        ROS: Review of Systems  Constitutional: Negative.   HENT: Negative.   Eyes: Negative.   Respiratory: Negative.   Cardiovascular: Negative.     Gastrointestinal: Negative.   Genitourinary: Negative.   Musculoskeletal: Positive for joint pain.  Skin: Negative.   Neurological: Negative.   Endo/Heme/Allergies: Negative.   Psychiatric/Behavioral: Negative.      Physican Exam: BP : 136/80 ; Pulse : 58 ; Resp : 14; Physical Exam  Constitutional: He is oriented to person, place, and time and well-developed, well-nourished, and in no distress.  HENT:  Head: Normocephalic and atraumatic.  Nose: Nose normal.  Mouth/Throat: Oropharynx is clear and moist.  Eyes: Conjunctivae and EOM are normal. Pupils are equal, round, and reactive to light.  Neck: Neck supple. No JVD present. No tracheal deviation present. No thyromegaly present.  Cardiovascular: Normal rate, regular rhythm and normal heart sounds.  Exam reveals no gallop and no friction rub.   No murmur heard. Pulmonary/Chest: Effort normal and breath sounds normal. No stridor.  Abdominal: Soft. There is no tenderness. There is no guarding.  Musculoskeletal:       Right hip: He exhibits decreased range of motion and bony tenderness. He exhibits normal strength and no swelling.       Left hip: He exhibits decreased range of motion and bony tenderness. He exhibits normal strength and no swelling.  Lymphadenopathy:    He has no cervical adenopathy.  Neurological: He is alert and oriented to person, place, and time.  Skin: Skin is warm and dry. No rash noted. No  erythema. No pallor.  Psychiatric: Affect normal.     Assessment/Plan Assessment: bilateral Hip OA and pain   Plan: Patient will undergo bilateral total hip arthroplasties, anterior approaches on 10/21/2011. Risks benefits and expectation were discussed with the patient. Patient understand risks, benefits and expectation and wishes to proceed.   Anastasio Auerbach Louie Flenner   PAC  10/19/2011, 7:04 PM

## 2011-10-20 MED ORDER — TRANEXAMIC ACID 100 MG/ML IV SOLN
770.0000 mg | Freq: Once | INTRAVENOUS | Status: DC
  Filled 2011-10-20: qty 7.7

## 2011-10-21 ENCOUNTER — Encounter (HOSPITAL_COMMUNITY): Payer: Self-pay | Admitting: Anesthesiology

## 2011-10-21 ENCOUNTER — Inpatient Hospital Stay (HOSPITAL_COMMUNITY): Payer: 59 | Admitting: Anesthesiology

## 2011-10-21 ENCOUNTER — Encounter (HOSPITAL_COMMUNITY)
Admission: RE | Disposition: A | Discharge status: Home Health | Payer: Self-pay | Source: Ambulatory Visit | Attending: Orthopedic Surgery

## 2011-10-21 ENCOUNTER — Inpatient Hospital Stay (HOSPITAL_COMMUNITY): Payer: 59

## 2011-10-21 ENCOUNTER — Inpatient Hospital Stay (HOSPITAL_COMMUNITY)
Admission: RE | Admit: 2011-10-21 | Discharge: 2011-10-24 | DRG: 462 | Disposition: A | Discharge status: Home Health | Payer: 59 | Source: Ambulatory Visit | Attending: Orthopedic Surgery | Admitting: Orthopedic Surgery

## 2011-10-21 ENCOUNTER — Encounter (HOSPITAL_COMMUNITY): Payer: Self-pay

## 2011-10-21 DIAGNOSIS — F17200 Nicotine dependence, unspecified, uncomplicated: Secondary | ICD-10-CM | POA: Diagnosis present

## 2011-10-21 DIAGNOSIS — Z96643 Presence of artificial hip joint, bilateral: Secondary | ICD-10-CM

## 2011-10-21 DIAGNOSIS — D62 Acute posthemorrhagic anemia: Secondary | ICD-10-CM | POA: Diagnosis not present

## 2011-10-21 DIAGNOSIS — E785 Hyperlipidemia, unspecified: Secondary | ICD-10-CM | POA: Diagnosis present

## 2011-10-21 DIAGNOSIS — Z01812 Encounter for preprocedural laboratory examination: Secondary | ICD-10-CM

## 2011-10-21 DIAGNOSIS — M1610 Unilateral primary osteoarthritis, unspecified hip: Principal | ICD-10-CM | POA: Diagnosis present

## 2011-10-21 DIAGNOSIS — M169 Osteoarthritis of hip, unspecified: Principal | ICD-10-CM | POA: Diagnosis present

## 2011-10-21 LAB — HEMOGLOBIN AND HEMATOCRIT, BLOOD: Hemoglobin: 8.9 g/dL — ABNORMAL LOW (ref 13.0–17.0)

## 2011-10-21 SURGERY — ARTHROPLASTY, HIP, BILATERAL, TOTAL, ANTERIOR APPROACH
Anesthesia: Epidural | Site: Hip | Laterality: Bilateral | Wound class: Clean

## 2011-10-21 MED ORDER — NALOXONE HCL 0.4 MG/ML IJ SOLN: 0.4000 mg | INTRAMUSCULAR | Status: DC | PRN

## 2011-10-21 MED ORDER — EPHEDRINE SULFATE 50 MG/ML IJ SOLN
5.0000 mg | INTRAMUSCULAR | Status: DC | PRN
  Administered 2011-10-21 (×8): 5 mg via INTRAVENOUS

## 2011-10-21 MED ORDER — PROPOFOL 10 MG/ML IV EMUL
INTRAVENOUS | Status: DC | PRN
  Administered 2011-10-21: 30 mg via INTRAVENOUS
  Administered 2011-10-21: 20 mg via INTRAVENOUS

## 2011-10-21 MED ORDER — BISACODYL 5 MG PO TBEC: 5.0000 mg | DELAYED_RELEASE_TABLET | Freq: Every day | ORAL | Status: DC | PRN

## 2011-10-21 MED ORDER — DIPHENHYDRAMINE HCL 25 MG PO CAPS: 25.0000 mg | ORAL_CAPSULE | Freq: Four times a day (QID) | ORAL | Status: DC | PRN

## 2011-10-21 MED ORDER — EPHEDRINE SULFATE 50 MG/ML IJ SOLN
INTRAMUSCULAR | Status: AC
  Administered 2011-10-21: 5 mg via INTRAVENOUS
  Filled 2011-10-21: qty 1

## 2011-10-21 MED ORDER — ONDANSETRON HCL 4 MG/2ML IJ SOLN
INTRAMUSCULAR | Status: AC
  Administered 2011-10-21: 4 mg
  Filled 2011-10-21: qty 2

## 2011-10-21 MED ORDER — DEXAMETHASONE SODIUM PHOSPHATE 10 MG/ML IJ SOLN: 10.0000 mg | Freq: Once | INTRAMUSCULAR | Status: DC

## 2011-10-21 MED ORDER — ACETAMINOPHEN 650 MG RE SUPP: 650.0000 mg | Freq: Four times a day (QID) | RECTAL | Status: DC | PRN

## 2011-10-21 MED ORDER — SERTRALINE HCL 50 MG PO TABS
150.0000 mg | ORAL_TABLET | Freq: Every day | ORAL | Status: DC
  Administered 2011-10-22 – 2011-10-24 (×3): 150 mg via ORAL
  Filled 2011-10-21 (×4): qty 1

## 2011-10-21 MED ORDER — LACTATED RINGERS IV SOLN
INTRAVENOUS | Status: DC | PRN
  Administered 2011-10-21 (×4): via INTRAVENOUS

## 2011-10-21 MED ORDER — METHOCARBAMOL 100 MG/ML IJ SOLN
500.0000 mg | Freq: Four times a day (QID) | INTRAVENOUS | Status: DC | PRN
  Administered 2011-10-21 – 2011-10-22 (×3): 500 mg via INTRAVENOUS
  Filled 2011-10-21 (×3): qty 5

## 2011-10-21 MED ORDER — KETOROLAC TROMETHAMINE 60 MG/2ML IM SOLN: 60.0000 mg | Freq: Once | INTRAMUSCULAR | Status: AC | PRN

## 2011-10-21 MED ORDER — METOCLOPRAMIDE HCL 10 MG PO TABS: 5.0000 mg | ORAL_TABLET | Freq: Three times a day (TID) | ORAL | Status: DC | PRN

## 2011-10-21 MED ORDER — POLYETHYLENE GLYCOL 3350 17 G PO PACK
17.0000 g | PACK | Freq: Two times a day (BID) | ORAL | Status: DC
  Administered 2011-10-22 – 2011-10-23 (×2): 17 g via ORAL
  Filled 2011-10-21 (×7): qty 1

## 2011-10-21 MED ORDER — CEFAZOLIN SODIUM 1-5 GM-% IV SOLN
INTRAVENOUS | Status: DC | PRN
  Administered 2011-10-21: 2 g via INTRAVENOUS

## 2011-10-21 MED ORDER — PANTOPRAZOLE SODIUM 40 MG PO TBEC
40.0000 mg | DELAYED_RELEASE_TABLET | Freq: Every day | ORAL | Status: DC
  Administered 2011-10-24: 40 mg via ORAL
  Filled 2011-10-21 (×3): qty 1

## 2011-10-21 MED ORDER — SODIUM CHLORIDE 0.9 % IJ SOLN
14.0000 mL/h | INTRAMUSCULAR | Status: DC
  Filled 2011-10-21 (×12): qty 7.6

## 2011-10-21 MED ORDER — ONDANSETRON HCL 4 MG PO TABS: 4.0000 mg | ORAL_TABLET | Freq: Four times a day (QID) | ORAL | Status: DC | PRN

## 2011-10-21 MED ORDER — BUPIVACAINE HCL 0.75 % IJ SOLN
INTRAMUSCULAR | Status: DC
  Administered 2011-10-21: 14:00:00 via EPIDURAL
  Filled 2011-10-21 (×5): qty 25

## 2011-10-21 MED ORDER — HETASTARCH-ELECTROLYTES 6 % IV SOLN
INTRAVENOUS | Status: DC | PRN
  Administered 2011-10-21: 11:00:00 via INTRAVENOUS

## 2011-10-21 MED ORDER — TETRACAINE HCL 1 % IJ SOLN
INTRAMUSCULAR | Status: DC | PRN
  Administered 2011-10-21: 12 mg via INTRASPINAL

## 2011-10-21 MED ORDER — PROMETHAZINE HCL 25 MG/ML IJ SOLN
INTRAMUSCULAR | Status: AC
  Filled 2011-10-21: qty 1

## 2011-10-21 MED ORDER — SIMVASTATIN 40 MG PO TABS
40.0000 mg | ORAL_TABLET | Freq: Every day | ORAL | Status: DC
  Administered 2011-10-22 – 2011-10-24 (×3): 40 mg via ORAL
  Filled 2011-10-21 (×4): qty 1

## 2011-10-21 MED ORDER — DOCUSATE SODIUM 100 MG PO CAPS
100.0000 mg | ORAL_CAPSULE | Freq: Two times a day (BID) | ORAL | Status: DC
  Administered 2011-10-21 – 2011-10-24 (×6): 100 mg via ORAL
  Filled 2011-10-21 (×7): qty 1

## 2011-10-21 MED ORDER — DIPHENHYDRAMINE HCL 25 MG PO CAPS
25.0000 mg | ORAL_CAPSULE | ORAL | Status: DC | PRN
  Administered 2011-10-23: 25 mg via ORAL
  Filled 2011-10-21 (×2): qty 1

## 2011-10-21 MED ORDER — MEPERIDINE HCL 25 MG/ML IJ SOLN: 6.2500 mg | INTRAMUSCULAR | Status: DC | PRN

## 2011-10-21 MED ORDER — RIVAROXABAN 10 MG PO TABS
10.0000 mg | ORAL_TABLET | ORAL | Status: DC
  Administered 2011-10-22 – 2011-10-23 (×2): 10 mg via ORAL
  Filled 2011-10-21 (×3): qty 1

## 2011-10-21 MED ORDER — ZOLPIDEM TARTRATE 5 MG PO TABS
5.0000 mg | ORAL_TABLET | Freq: Every evening | ORAL | Status: DC | PRN
  Administered 2011-10-23: 5 mg via ORAL
  Filled 2011-10-21: qty 1

## 2011-10-21 MED ORDER — CEFAZOLIN SODIUM-DEXTROSE 2-3 GM-% IV SOLR
INTRAVENOUS | Status: AC
  Filled 2011-10-21: qty 50

## 2011-10-21 MED ORDER — KETOROLAC TROMETHAMINE 30 MG/ML IJ SOLN: 30.0000 mg | Freq: Four times a day (QID) | INTRAMUSCULAR | Status: AC | PRN

## 2011-10-21 MED ORDER — FERROUS SULFATE 325 (65 FE) MG PO TABS
325.0000 mg | ORAL_TABLET | Freq: Three times a day (TID) | ORAL | Status: DC
  Administered 2011-10-22 – 2011-10-24 (×7): 325 mg via ORAL
  Filled 2011-10-21 (×10): qty 1

## 2011-10-21 MED ORDER — NALBUPHINE HCL 10 MG/ML IJ SOLN: 5.0000 mg | INTRAMUSCULAR | Status: DC | PRN

## 2011-10-21 MED ORDER — PHENOL 1.4 % MT LIQD
1.0000 | OROMUCOSAL | Status: DC | PRN
  Filled 2011-10-21: qty 177

## 2011-10-21 MED ORDER — ACETAMINOPHEN 325 MG PO TABS
650.0000 mg | ORAL_TABLET | Freq: Four times a day (QID) | ORAL | Status: DC | PRN
  Administered 2011-10-23: 650 mg via ORAL
  Filled 2011-10-21: qty 2

## 2011-10-21 MED ORDER — IBUPROFEN 600 MG PO TABS: 600.0000 mg | ORAL_TABLET | Freq: Four times a day (QID) | ORAL | Status: DC | PRN

## 2011-10-21 MED ORDER — METHOCARBAMOL 500 MG PO TABS
500.0000 mg | ORAL_TABLET | Freq: Four times a day (QID) | ORAL | Status: DC | PRN
  Administered 2011-10-22 – 2011-10-23 (×2): 500 mg via ORAL
  Filled 2011-10-21 (×2): qty 1

## 2011-10-21 MED ORDER — DIPHENHYDRAMINE HCL 50 MG/ML IJ SOLN: 12.5000 mg | INTRAMUSCULAR | Status: DC | PRN

## 2011-10-21 MED ORDER — FLEET ENEMA 7-19 GM/118ML RE ENEM: 1.0000 | ENEMA | Freq: Once | RECTAL | Status: AC | PRN

## 2011-10-21 MED ORDER — ONDANSETRON HCL 4 MG/2ML IJ SOLN: 4.0000 mg | Freq: Four times a day (QID) | INTRAMUSCULAR | Status: DC | PRN

## 2011-10-21 MED ORDER — 0.9 % SODIUM CHLORIDE (POUR BTL) OPTIME
TOPICAL | Status: DC | PRN
  Administered 2011-10-21: 1000 mL

## 2011-10-21 MED ORDER — SCOPOLAMINE 1 MG/3DAYS TD PT72
1.0000 | MEDICATED_PATCH | TRANSDERMAL | Status: DC
  Administered 2011-10-22: 1.5 mg via TRANSDERMAL
  Filled 2011-10-21: qty 1

## 2011-10-21 MED ORDER — LACTATED RINGERS IV SOLN: INTRAVENOUS | Status: DC

## 2011-10-21 MED ORDER — HYDROCODONE-ACETAMINOPHEN 7.5-325 MG PO TABS
1.0000 | ORAL_TABLET | ORAL | Status: DC
  Administered 2011-10-21 – 2011-10-24 (×10): 2 via ORAL
  Administered 2011-10-24: 1 via ORAL
  Filled 2011-10-21: qty 1
  Filled 2011-10-21 (×11): qty 2

## 2011-10-21 MED ORDER — DEXTROSE 10 % NICU IV FLUID BOLUS
5.0000 mL | INJECTION | INTRAVENOUS | Status: DC
  Filled 2011-10-21: qty 5

## 2011-10-21 MED ORDER — ALUM & MAG HYDROXIDE-SIMETH 200-200-20 MG/5ML PO SUSP
30.0000 mL | ORAL | Status: DC | PRN
  Administered 2011-10-21: 30 mL via ORAL
  Filled 2011-10-21: qty 30

## 2011-10-21 MED ORDER — SODIUM CHLORIDE 0.9 % IV SOLN
100.0000 mL/h | INTRAVENOUS | Status: DC
  Administered 2011-10-21 – 2011-10-22 (×2): 100 mL/h via INTRAVENOUS
  Filled 2011-10-21 (×16): qty 1000

## 2011-10-21 MED ORDER — FENTANYL CITRATE 0.05 MG/ML IJ SOLN
25.0000 ug | INTRAMUSCULAR | Status: DC | PRN
  Administered 2011-10-21: 50 ug via INTRAVENOUS

## 2011-10-21 MED ORDER — SODIUM CHLORIDE 0.9 % IV SOLN: 1.0000 ug/kg/h | INTRAVENOUS | Status: DC | PRN

## 2011-10-21 MED ORDER — LACTATED RINGERS IV BOLUS (SEPSIS)
500.0000 mL | Freq: Once | INTRAVENOUS | Status: AC
  Administered 2011-10-21: 500 mL via INTRAVENOUS

## 2011-10-21 MED ORDER — DIPHENHYDRAMINE HCL 50 MG/ML IJ SOLN: 25.0000 mg | INTRAMUSCULAR | Status: DC | PRN

## 2011-10-21 MED ORDER — SODIUM CHLORIDE 0.9 % IV SOLN
INTRAVENOUS | Status: DC
  Administered 2011-10-21: 21:00:00 via EPIDURAL
  Filled 2011-10-21 (×10): qty 25

## 2011-10-21 MED ORDER — CEFAZOLIN SODIUM-DEXTROSE 2-3 GM-% IV SOLR
2.0000 g | Freq: Four times a day (QID) | INTRAVENOUS | Status: AC
  Administered 2011-10-21 – 2011-10-22 (×3): 2 g via INTRAVENOUS
  Filled 2011-10-21 (×3): qty 50

## 2011-10-21 MED ORDER — METOCLOPRAMIDE HCL 5 MG/ML IJ SOLN: 5.0000 mg | Freq: Three times a day (TID) | INTRAMUSCULAR | Status: DC | PRN

## 2011-10-21 MED ORDER — MENTHOL 3 MG MT LOZG
1.0000 | LOZENGE | OROMUCOSAL | Status: DC | PRN
  Filled 2011-10-21: qty 9

## 2011-10-21 MED ORDER — PROMETHAZINE HCL 25 MG/ML IJ SOLN
6.2500 mg | INTRAMUSCULAR | Status: DC | PRN
  Administered 2011-10-21: 6.25 mg via INTRAVENOUS

## 2011-10-21 MED ORDER — SODIUM CHLORIDE 0.9 % IJ SOLN: 3.0000 mL | INTRAMUSCULAR | Status: DC | PRN

## 2011-10-21 MED ORDER — DEXAMETHASONE SODIUM PHOSPHATE 10 MG/ML IJ SOLN
10.0000 mg | Freq: Once | INTRAMUSCULAR | Status: AC
  Administered 2011-10-22: 10 mg via INTRAVENOUS
  Filled 2011-10-21: qty 1

## 2011-10-21 MED ORDER — CEFAZOLIN SODIUM-DEXTROSE 2-3 GM-% IV SOLR: 2.0000 g | Freq: Once | INTRAVENOUS | Status: DC

## 2011-10-21 MED ORDER — PROPOFOL 10 MG/ML IV EMUL
INTRAVENOUS | Status: DC | PRN
  Administered 2011-10-21: 75 ug/kg/min via INTRAVENOUS

## 2011-10-21 MED ORDER — METOCLOPRAMIDE HCL 5 MG/ML IJ SOLN
10.0000 mg | Freq: Three times a day (TID) | INTRAMUSCULAR | Status: DC | PRN
  Filled 2011-10-21: qty 2

## 2011-10-21 MED ORDER — ONDANSETRON HCL 4 MG/2ML IJ SOLN
4.0000 mg | Freq: Three times a day (TID) | INTRAMUSCULAR | Status: DC | PRN
  Administered 2011-10-21: 4 mg via INTRAVENOUS
  Filled 2011-10-21: qty 2

## 2011-10-21 MED ORDER — MIDAZOLAM HCL 5 MG/5ML IJ SOLN
INTRAMUSCULAR | Status: DC | PRN
  Administered 2011-10-21 (×2): 2 mg via INTRAVENOUS

## 2011-10-21 MED ORDER — HYDROMORPHONE HCL PF 1 MG/ML IJ SOLN
0.5000 mg | INTRAMUSCULAR | Status: DC | PRN
  Administered 2011-10-22: 0.5 mg via INTRAVENOUS
  Administered 2011-10-22 (×3): 1 mg via INTRAVENOUS
  Administered 2011-10-22: 0.5 mg via INTRAVENOUS
  Filled 2011-10-21 (×5): qty 1

## 2011-10-21 SURGICAL SUPPLY — 43 items
ADH SKN CLS APL DERMABOND .7 (GAUZE/BANDAGES/DRESSINGS) ×2
BAG SPEC THK2 15X12 ZIP CLS (MISCELLANEOUS) ×2
BAG ZIPLOCK 12X15 (MISCELLANEOUS) ×4 IMPLANT
BLADE SAW SGTL 18X1.27X75 (BLADE) ×2 IMPLANT
CELLS DAT CNTRL 66122 CELL SVR (MISCELLANEOUS) ×2 IMPLANT
CLOTH BEACON ORANGE TIMEOUT ST (SAFETY) ×4 IMPLANT
DERMABOND ADVANCED (GAUZE/BANDAGES/DRESSINGS) ×2
DERMABOND ADVANCED .7 DNX12 (GAUZE/BANDAGES/DRESSINGS) ×2 IMPLANT
DRAPE C-ARM 42X72 X-RAY (DRAPES) ×4 IMPLANT
DRAPE STERI IOBAN 125X83 (DRAPES) ×4 IMPLANT
DRAPE U-SHAPE 47X51 STRL (DRAPES) ×14 IMPLANT
DRSG AQUACEL AG ADV 3.5X10 (GAUZE/BANDAGES/DRESSINGS) ×4 IMPLANT
DRSG TEGADERM 4X4.75 (GAUZE/BANDAGES/DRESSINGS) ×4 IMPLANT
DURAPREP 26ML APPLICATOR (WOUND CARE) ×4 IMPLANT
ELECT BLADE TIP CTD 4 INCH (ELECTRODE) ×2 IMPLANT
ELECT REM PT RETURN 9FT ADLT (ELECTROSURGICAL) ×4
ELECTRODE REM PT RTRN 9FT ADLT (ELECTROSURGICAL) ×2 IMPLANT
EVACUATOR 1/8 PVC DRAIN (DRAIN) ×4 IMPLANT
FACESHIELD LNG OPTICON STERILE (SAFETY) ×8 IMPLANT
GAUZE SPONGE 2X2 8PLY STRL LF (GAUZE/BANDAGES/DRESSINGS) ×2 IMPLANT
GLOVE BIOGEL PI IND STRL 7.5 (GLOVE) ×2 IMPLANT
GLOVE BIOGEL PI IND STRL 8 (GLOVE) ×2 IMPLANT
GLOVE BIOGEL PI INDICATOR 7.5 (GLOVE) ×2
GLOVE BIOGEL PI INDICATOR 8 (GLOVE) ×2
GLOVE ECLIPSE 8.0 STRL XLNG CF (GLOVE) ×4 IMPLANT
GLOVE ORTHO TXT STRL SZ7.5 (GLOVE) ×8 IMPLANT
GOWN STRL NON-REIN LRG LVL3 (GOWN DISPOSABLE) ×2 IMPLANT
KIT BASIN OR (CUSTOM PROCEDURE TRAY) ×4 IMPLANT
PACK TOTAL JOINT (CUSTOM PROCEDURE TRAY) ×2 IMPLANT
PADDING CAST COTTON 6X4 STRL (CAST SUPPLIES) ×2 IMPLANT
PENCIL BUTTON HOLSTER BLD 10FT (ELECTRODE) ×2 IMPLANT
RETRACTOR WND ALEXIS 18 MED (MISCELLANEOUS) ×2 IMPLANT
RTRCTR WOUND ALEXIS 18CM MED (MISCELLANEOUS) ×4
SPONGE GAUZE 2X2 8PLY STRL LF (GAUZE/BANDAGES/DRESSINGS) ×2 IMPLANT
SPONGE GAUZE 2X2 STER 10/PKG (GAUZE/BANDAGES/DRESSINGS) ×2
SUCTION FRAZIER 12FR DISP (SUCTIONS) ×2 IMPLANT
SUT MNCRL AB 4-0 PS2 18 (SUTURE) ×4 IMPLANT
SUT VIC AB 1 CT1 36 (SUTURE) ×16 IMPLANT
SUT VIC AB 2-0 CT1 27 (SUTURE) ×8
SUT VIC AB 2-0 CT1 TAPERPNT 27 (SUTURE) ×4 IMPLANT
TOWEL OR 17X26 10 PK STRL BLUE (TOWEL DISPOSABLE) ×4 IMPLANT
TRAY FOLEY CATH 14FRSI W/METER (CATHETERS) ×2 IMPLANT
YANKAUER SUCT BULB TIP 10FT TU (MISCELLANEOUS) ×2 IMPLANT

## 2011-10-21 NOTE — Plan of Care (Signed)
Problem: Consults Goal: Diagnosis- Total Joint Replacement  Bilateral anterior hip replacments

## 2011-10-21 NOTE — Anesthesia Preprocedure Evaluation (Signed)
Anesthesia Evaluation  Patient identified by MRN, date of birth, ID band Patient awake    Reviewed: Allergy & Precautions, H&P , NPO status , Patient's Chart, lab work & pertinent test results  History of Anesthesia Complications Negative for: history of anesthetic complications  Airway Mallampati: II TM Distance: >3 FB Neck ROM: Full    Dental No notable dental hx. (+) Teeth Intact and Dental Advisory Given,    Pulmonary neg pulmonary ROS,  clear to auscultation  Pulmonary exam normal       Cardiovascular neg cardio ROS Regular Normal    Neuro/Psych Negative Neurological ROS  Negative Psych ROS   GI/Hepatic negative GI ROS, Neg liver ROS,   Endo/Other  Negative Endocrine ROS  Renal/GU negative Renal ROS  Genitourinary negative   Musculoskeletal negative musculoskeletal ROS (+)   Abdominal   Peds negative pediatric ROS (+)  Hematology negative hematology ROS (+)   Anesthesia Other Findings   Reproductive/Obstetrics negative OB ROS                           Anesthesia Physical Anesthesia Plan  ASA: I  Anesthesia Plan: Combined Spinal and Epidural   Post-op Pain Management:    Induction:   Airway Management Planned: Simple Face Mask  Additional Equipment:   Intra-op Plan:   Post-operative Plan:   Informed Consent: I have reviewed the patients History and Physical, chart, labs and discussed the procedure including the risks, benefits and alternatives for the proposed anesthesia with the patient or authorized representative who has indicated his/her understanding and acceptance.   Dental advisory given  Plan Discussed with: CRNA  Anesthesia Plan Comments:         Anesthesia Quick Evaluation

## 2011-10-21 NOTE — Anesthesia Postprocedure Evaluation (Signed)
Anesthesia Post Note  Patient: Nathaniel Mejia  Procedure(s) Performed:  BILATERAL ANTERIOR TOTAL HIP ARTHROPLASTY - Bilateral Total Hip Arthroplasty, Anterior Approach (C-Arm) mac anesthesia  Anesthesia type: Spinal  Patient location: PACU  Post pain: Pain level controlled  Post assessment: Post-op Vital signs reviewed  Last Vitals:  Filed Vitals:   10/21/11 1610  BP:   Pulse: 65  Temp:   Resp: 17    Post vital signs: Reviewed  Level of consciousness: sedated  Complications: No apparent anesthesia complications

## 2011-10-21 NOTE — Transfer of Care (Signed)
Immediate Anesthesia Transfer of Care Note  Patient: Nathaniel Mejia  Procedure(s) Performed:  BILATERAL ANTERIOR TOTAL HIP ARTHROPLASTY - Bilateral Total Hip Arthroplasty, Anterior Approach (C-Arm) mac anesthesia  Patient Location: PACU  Anesthesia Type: Regional and MAC combined with regional for post-op pain  Level of Consciousness: awake and sedated  Airway & Oxygen Therapy: Patient Spontanous Breathing and Patient connected to face mask oxygen  Post-op Assessment: Report given to PACU RN and Post -op Vital signs reviewed and stable  Post vital signs: Reviewed and stable  Complications: No apparent anesthesia complications

## 2011-10-21 NOTE — Anesthesia Procedure Notes (Signed)
Epidural/Spinal Patient location during procedure: OR Start time: 10/21/2011 9:40 AM End time: 10/21/2011 10:05 AM  Staffing Anesthesiologist: Lucille Passy F Performed by: anesthesiologist   Preanesthetic Checklist Completed: patient identified, site marked, surgical consent, pre-op evaluation, timeout performed, IV checked, risks and benefits discussed and monitors and equipment checked  Epidural Patient position: sitting Prep: Betadine Patient monitoring: heart rate, continuous pulse ox and blood pressure Approach: midline Injection technique: LOR air  Needle:  Needle type: Tuohy  Needle gauge: 18 G Needle length: 9 cm Catheter type: closed end flexible Catheter size: 20 Guage Test dose: negative and 1.5% lidocaine  Assessment Sensory level: T8  Additional Notes Test dose 1.5% Lidocaine with epi 1:200,000  Patient tolerated the insertion well without complications.

## 2011-10-21 NOTE — Interval H&P Note (Signed)
History and Physical Interval Note:  10/21/2011 7:44 AM  Nathaniel Mejia  has presented today for surgery, with the diagnosis of osteoarthritis bilateral hip  The various methods of treatment have been discussed with the patient and family. After consideration of risks, benefits and other options for treatment, the patient has consented to  Procedure(s): BILATERAL ANTERIOR TOTAL HIP ARTHROPLASTY as a surgical intervention .  The patients' history has been reviewed, patient examined, no change in status, stable for surgery.  I have reviewed the patients' chart and labs.  Questions were answered to the patient's satisfaction.     Shelda Pal

## 2011-10-22 ENCOUNTER — Encounter (HOSPITAL_COMMUNITY): Payer: Self-pay | Admitting: Anesthesiology

## 2011-10-22 LAB — CBC
Hemoglobin: 8.1 g/dL — ABNORMAL LOW (ref 13.0–17.0)
MCH: 30.2 pg (ref 26.0–34.0)
MCHC: 33.1 g/dL (ref 30.0–36.0)
RDW: 12.7 % (ref 11.5–15.5)

## 2011-10-22 LAB — BASIC METABOLIC PANEL
BUN: 19 mg/dL (ref 6–23)
Calcium: 8.2 mg/dL — ABNORMAL LOW (ref 8.4–10.5)
GFR calc Af Amer: 72 mL/min — ABNORMAL LOW (ref >90)
GFR calc non Af Amer: 62 mL/min — ABNORMAL LOW (ref >90)
Glucose, Bld: 132 mg/dL — ABNORMAL HIGH (ref 70–99)
Sodium: 138 mEq/L (ref 135–145)

## 2011-10-22 NOTE — Progress Notes (Signed)
Physical Therapy Treatment Patient Details Name: Shoichi Mielke MRN: [id number] DOB: 10-02-1951 Today's Date: 10/22/2011 1530 - 1548; TA PT Assessment/Plan  PT - Assessment/Plan PT Plan: Discharge plan remains appropriate PT Frequency: 7X/week Follow Up Recommendations: Home health PT Equipment Recommended: Rolling walker with 5" wheels PT Goals  Acute Rehab PT Goals Time For Goal Achievement: 7 days Pt will go Supine/Side to Sit: with supervision PT Goal: Supine/Side to Sit - Progress: Progressing toward goal Pt will go Sit to Supine/Side: with supervision PT Goal: Sit to Supine/Side - Progress: Progressing toward goal Pt will go Sit to Stand: with supervision PT Goal: Sit to Stand - Progress: Progressing toward goal Pt will go Stand to Sit: with supervision PT Goal: Stand to Sit - Progress: Progressing toward goal Pt will Ambulate: 51 - 150 feet PT Goal: Ambulate - Progress: Progressing toward goal  PT Treatment Precautions/Restrictions  Restrictions Weight Bearing Restrictions: No Other Position/Activity Restrictions: WBAT Mobility (including Balance) Bed Mobility Sit to Supine - Right: 1: +2 Total assist (pt 60%) Sit to Supine - Right Details (indicate cue type and reason): cues for sequence/technique Transfers Sit to Stand: 1: +2 Total assist;Without upper extremity assist;With armrests;From chair/3-in-1 Sit to Stand Details (indicate cue type and reason): cues for use of UEs and for position of LEs Stand to Sit: 1: +2 Total assist;To bed;With armrests;With upper extremity assist Stand to Sit Details: cues for use of UEs and for position of LEs Ambulation/Gait Ambulation/Gait Assistance: 1: +2 Total assist Ambulation/Gait Assistance Details (indicate cue type and reason): cues for UE WB, sequence, posture, and position from RW Ambulation Distance (Feet): 4 Feet Assistive device: Rolling walker Gait Pattern: Step-to pattern    Exercise    End of Session PT - End of  Session Activity Tolerance: Patient limited by pain;Patient limited by fatigue Patient left: in bed;with call bell in reach;with family/visitor present Nurse Communication: Mobility status for transfers;Mobility status for ambulation General Behavior During Session: [facility] for tasks performed Cognition: [facility] for tasks performed  Aundrea Higginbotham 10/22/2011, 4:53 PM

## 2011-10-22 NOTE — Progress Notes (Signed)
Subjective: 1 Day Post-Op Procedure(s) (LRB): BILATERAL ANTERIOR TOTAL HIP ARTHROPLASTY (Bilateral)   Patient reports pain as mild. No events. Pt states that his BP normally runs low.   Objective:   VITALS:   Filed Vitals:   10/22/11 0525  BP: 82/38  Pulse: 58  Temp: 98.2 F (36.8 C)  Resp: 20    Neurovascular intact Dorsiflexion/Plantar flexion intact Incision: dressing C/D/I No cellulitis present Compartment soft  LABS  Basename 10/22/11 0421 10/21/11 1446  HGB 8.1* 8.9*  HCT 24.5* 27.4*  WBC 7.2 --  PLT 171 --     Basename 10/22/11 0421  NA 138  K 3.7  BUN 19  CREATININE 1.24  GLUCOSE 132*    Assessment/Plan: 1 Day Post-Op Procedure(s) (LRB): BILATERAL ANTERIOR TOTAL HIP ARTHROPLASTY (Bilateral)  Foley D/C'ed 6 hours after epidural D/C'ed HV drain D/C'ed Advance diet Up with therapy D/C IV fluids Discharge home with home health Thursday / Friday   Anastasio Auerbach. Timur Nibert   PAC  10/22/2011, 9:08 AM

## 2011-10-22 NOTE — Progress Notes (Signed)
Utilization review completed.  

## 2011-10-22 NOTE — Progress Notes (Signed)
Physical Therapy Evaluation Patient Details Name: Nathaniel Mejia MRN: [id number] DOB: 1951-10-22 Today's Date: 10/22/2011 1031 - 1101; EVAL Problem List:  Patient Active Problem List  Diagnoses  . HYPERGLYCEMIA  . Hyperlipidemia  . S/P bilateral hip replacements    Past Medical History:  Past Medical History  Diagnosis Date  . Arthritis     bilateral hip oa and pain  . Hyperlipidemia    Past Surgical History:  Past Surgical History  Procedure Date  . Transurethral resection of prostate 2010    Dr Retta Diones  . Appendectomy   . Vasectomy   . Colonoscopy     adenomatous polyp 2012    PT Assessment/Plan/Recommendation PT Assessment Clinical Impression Statement: Pt with bil THR (and dir) presents with bil LE decreased strength/ROM and decreased functional mobility.  Pt will benefit from skilled PT intervention to maximize IND for d/c home PT Recommendation/Assessment: Patient will need skilled PT in the acute care venue PT Problem List: Decreased strength;Decreased activity tolerance;Decreased mobility;Decreased knowledge of use of DME;Pain PT Therapy Diagnosis : Difficulty walking PT Plan PT Frequency: 7X/week PT Treatment/Interventions: DME instruction;Gait training;Stair training;Functional mobility training;Therapeutic activities;Therapeutic exercise;Patient/family education PT Recommendation Recommendations for Other Services: OT consult Follow Up Recommendations: Home health PT Equipment Recommended: Rolling walker with 5" wheels PT Goals  Acute Rehab PT Goals PT Goal Formulation: With patient Time For Goal Achievement: 7 days Pt will go Supine/Side to Sit: with supervision PT Goal: Supine/Side to Sit - Progress: Not met Pt will go Sit to Supine/Side: with supervision PT Goal: Sit to Supine/Side - Progress: Not met Pt will go Sit to Stand: with supervision PT Goal: Sit to Stand - Progress: Not met Pt will go Stand to Sit: with supervision PT Goal: Stand to Sit  - Progress: Not met Pt will Ambulate: 51 - 150 feet PT Goal: Ambulate - Progress: Not met Pt will Go Up / Down Stairs: 3-5 stairs;with min assist;with least restrictive assistive device PT Goal: Up/Down Stairs - Progress: Not met  PT Evaluation Precautions/Restrictions  Restrictions Weight Bearing Restrictions: No Other Position/Activity Restrictions: WBAT Prior Functioning  Home Living Lives With: Spouse Receives Help From: Family Type of Home: House Home Layout: One level Home Access: Stairs to enter Entrance Stairs-Rails: Right Entrance Stairs-Number of Steps: 3 Prior Function Level of Independence: Independent with transfers;Independent with gait;Independent with basic ADLs Able to Take Stairs?: Yes Cognition Cognition Arousal/Alertness: Awake/alert Overall Cognitive Status: Appears within functional limits for tasks assessed Orientation Level: Oriented X4 Sensation/Coordination Coordination Gross Motor Movements are Fluid and Coordinated: Yes Extremity Assessment RUE Assessment RUE Assessment: Within Functional Limits LUE Assessment LUE Assessment: Within Functional Limits RLE Assessment RLE Assessment: Exceptions to [facility] (2+/5) LLE Assessment LLE Assessment: Exceptions to [facility] (3/5) Mobility (including Balance) Bed Mobility Bed Mobility: Yes Supine to Sit: 1: +2 Total assist (pt 70%) Supine to Sit Details (indicate cue type and reason): cues for sequence/technique Transfers Transfers: Yes Sit to Stand: 1: +2 Total assist Sit to Stand Details (indicate cue type and reason): cues for LE position and use of UEs - pt 70% elevated bed Stand to Sit: 1: +2 Total assist Stand to Sit Details: cues for LE position and use of UEs - pt 70% elevated bed (pt 60%) Ambulation/Gait Ambulation/Gait: Yes Ambulation/Gait Assistance: 1: +2 Total assist Ambulation/Gait Assistance Details (indicate cue type and reason): cues for UE WB, sequence, posture, and position from  RW Ambulation Distance (Feet): 3 Feet Assistive device: Rolling walker Gait Pattern: Step-to pattern    Exercise  Total Joint Exercises Ankle Circles/Pumps: AROM;10 reps;Supine;Both Heel Slides: AAROM;Both;15 reps;Supine Hip ABduction/ADduction: AAROM;10 reps;Supine;Both End of Session PT - End of Session Activity Tolerance: Treatment limited secondary to medical complications (Comment) (orthostatic - 74/35 after transfer to chair) Patient left: in chair;with call bell in reach;with family/visitor present Nurse Communication: Mobility status for transfers;Mobility status for ambulation General Behavior During Session: [facility] for tasks performed Cognition: [facility] for tasks performed  Arley Garant 10/22/2011, 1:57 PM

## 2011-10-22 NOTE — Progress Notes (Signed)
S: Called by RN to remove epidural catheter.  Has had no anticoagulants yet.  O: pain control has been fine. Has had only one dose of dilaudid recently. Has had percocets. No back pain. BP has been running low. HGB 8.2  A/P: Catheter removed. Tip intact. Bandaid applied.  Site looks good. No redness, No tenderness.  First dose of Xarelto to be given no earlier than 6 hours from now.  Discussed with RN

## 2011-10-23 LAB — CBC
HCT: 27.5 % — ABNORMAL LOW (ref 39.0–52.0)
Hemoglobin: 9.3 g/dL — ABNORMAL LOW (ref 13.0–17.0)
MCH: 30.3 pg (ref 26.0–34.0)
MCHC: 33.8 g/dL (ref 30.0–36.0)
MCV: 89.6 fL (ref 78.0–100.0)
RDW: 12.9 % (ref 11.5–15.5)

## 2011-10-23 LAB — TYPE AND SCREEN
ABO/RH(D): A POS
Antibody Screen: NEGATIVE
Unit division: 0
Unit division: 0

## 2011-10-23 LAB — BASIC METABOLIC PANEL
BUN: 15 mg/dL (ref 6–23)
Calcium: 8.9 mg/dL (ref 8.4–10.5)
Creatinine, Ser: 1.08 mg/dL (ref 0.50–1.35)
GFR calc Af Amer: 85 mL/min — ABNORMAL LOW (ref >90)
GFR calc non Af Amer: 73 mL/min — ABNORMAL LOW (ref >90)
Glucose, Bld: 175 mg/dL — ABNORMAL HIGH (ref 70–99)
Potassium: 4.3 mEq/L (ref 3.5–5.1)

## 2011-10-23 MED ORDER — DSS 100 MG PO CAPS: 100.0000 mg | ORAL_CAPSULE | Freq: Two times a day (BID) | ORAL | Status: AC

## 2011-10-23 MED ORDER — DIPHENHYDRAMINE HCL 25 MG PO CAPS: 25.0000 mg | ORAL_CAPSULE | Freq: Four times a day (QID) | ORAL | Status: AC | PRN

## 2011-10-23 MED ORDER — POLYETHYLENE GLYCOL 3350 17 G PO PACK: 17.0000 g | PACK | Freq: Two times a day (BID) | ORAL | Status: AC

## 2011-10-23 MED ORDER — FERROUS SULFATE 325 (65 FE) MG PO TABS: 325.0000 mg | ORAL_TABLET | Freq: Three times a day (TID) | ORAL | Status: AC

## 2011-10-23 MED ORDER — ASPIRIN EC 325 MG PO TBEC: 325.0000 mg | DELAYED_RELEASE_TABLET | Freq: Two times a day (BID) | ORAL | Status: AC

## 2011-10-23 MED ORDER — METHOCARBAMOL 500 MG PO TABS: 500.0000 mg | ORAL_TABLET | Freq: Four times a day (QID) | ORAL | Status: AC | PRN

## 2011-10-23 NOTE — Progress Notes (Signed)
Occupational Therapy Evaluation Patient Details Name: Nathaniel Mejia MRN: [id number] DOB: 1951-07-28 Today's Date: 10/23/2011  Problem List:  Patient Active Problem List  Diagnoses  . HYPERGLYCEMIA  . Hyperlipidemia  . S/P bilateral hip replacements    Past Medical History:  Past Medical History  Diagnosis Date  . Arthritis     bilateral hip oa and pain  . Hyperlipidemia    Past Surgical History:  Past Surgical History  Procedure Date  . Transurethral resection of prostate 2010    Dr Retta Diones  . Appendectomy   . Vasectomy   . Colonoscopy     adenomatous polyp 2012    OT Assessment/Plan/Recommendation OT Assessment Clinical Impression Statement: Pt is recovering from bilateral direct anterior hip replacements.  Will follow acutely for OT, specifically to practice shower transfer.  Do not anticipate need for post hospitalization OT.  Pt will need a 3 in 1 for home. OT Recommendation/Assessment: Patient will need skilled OT in the acute care venue OT Problem List: Impaired balance (sitting and/or standing);Pain;Decreased knowledge of use of DME or AE;Decreased knowledge of precautions OT Therapy Diagnosis : Generalized weakness;Acute pain OT Plan OT Frequency: Min 2X/week OT Treatment/Interventions: Self-care/ADL training;DME and/or AE instruction;Patient/family education OT Recommendation Follow Up Recommendations: None Equipment Recommended: 3 in 1 bedside comode Individuals Consulted Consulted and Agree with Results and Recommendations: Patient OT Goals Acute Rehab OT Goals OT Goal Formulation: With patient Time For Goal Achievement: 7 days ADL Goals Pt Will Perform Tub/Shower Transfer: with supervision;Shower transfer;Ambulation;Maintaining hip precautions;Other (comment) (onto 3 in 1) ADL Goal: Tub/Shower Transfer - Progress: Not met  OT Evaluation Precautions/Restrictions  Precautions Precautions: Anterior Hip;Fall Restrictions Weight Bearing Restrictions:  No Other Position/Activity Restrictions: WBAT Prior Functioning Home Living Lives With: Spouse Receives Help From: Family Type of Home: House Home Layout: One level Home Access: Stairs to enter Entrance Stairs-Rails: Right Entrance Stairs-Number of Steps: 3 Bathroom Shower/Tub: Psychologist, counselling;Door [employer] Toilet: Standard Home Adaptive Equipment: None Prior Function Level of Independence: Independent with transfers;Independent with gait;Independent with basic ADLs Able to Take Stairs?: Yes Driving: Yes Vocation: Full time employment ADL ADL Eating/Feeding: Performed;Independent Where Assessed - Eating/Feeding: Chair Grooming: Performed;Minimal assistance;Teeth care Where Assessed - Grooming: Standing at sink Upper Body Bathing: Simulated;Set up Where Assessed - Upper Body Bathing: Sitting, chair Lower Body Bathing: Simulated;Minimal assistance Lower Body Bathing Details (indicate cue type and reason): demonstrated use of long sponge Where Assessed - Lower Body Bathing: Sit to stand from chair;Sitting, chair Upper Body Dressing: Simulated;Set up Where Assessed - Upper Body Dressing: Sitting, chair Lower Body Dressing: Performed;Minimal assistance Lower Body Dressing Details (indicate cue type and reason): practiced use of reacher, sock aide, shown long shoe horn,pt and wife are aware of where items may be purchased Where Assessed - Lower Body Dressing: Sitting, chair;Sit to stand from chair Toilet Transfer: Simulated;Minimal assistance;Other (comment) (from/to chair, instructed in use of 3 in1 over toilet/in sho) Toilet Transfer Method: Ambulating Equipment Used: Rolling walker;Long-handled shoe horn;Long-handled sponge;Reacher;Sock aid Vision/Perception  Vision - History Baseline Vision: Wears contacts Financial risk analyst Arousal/Alertness: Awake/alert Overall Cognitive Status: Appears within functional limits for tasks assessed Orientation Level: Oriented X4   End of  Session OT - End of Session Equipment Utilized During Treatment: Gait belt Activity Tolerance: Patient tolerated treatment well Patient left: in chair;with call bell in reach;with family/visitor present General Behavior During Session: [facility] for tasks performed Cognition: [facility] for tasks performed   Evern Bio 10/23/2011, 10:10 AM [id number]

## 2011-10-23 NOTE — Progress Notes (Signed)
Physical Therapy Treatment Patient Details Name: Quindell Shere MRN: [id number] DOB: [birth year] Today's Date: 10/23/2011 5621 - 3086; TE, 2GT PT Assessment/Plan  PT - Assessment/Plan PT Plan: Discharge plan remains appropriate PT Frequency: 7X/week Follow Up Recommendations: Home health PT Equipment Recommended: 3 in 1 bedside comode PT Goals  Acute Rehab PT Goals Time For Goal Achievement: 7 days Pt will go Supine/Side to Sit: with supervision PT Goal: Supine/Side to Sit - Progress: Progressing toward goal Pt will go Sit to Stand: with supervision PT Goal: Sit to Stand - Progress: Progressing toward goal Pt will go Stand to Sit: with supervision PT Goal: Stand to Sit - Progress: Progressing toward goal Pt will Ambulate: 51 - 150 feet;with supervision;with rolling walker PT Goal: Ambulate - Progress: Progressing toward goal Pt will Go Up / Down Stairs: 3-5 stairs;with min assist;with least restrictive assistive device  PT Treatment Precautions/Restrictions  Precautions Precautions: Anterior Hip;Fall Restrictions Weight Bearing Restrictions: No Other Position/Activity Restrictions: WBAT Mobility (including Balance) Bed Mobility Supine to Sit: 4: Min assist;3: Mod assist Transfers Sit to Stand: 4: Min assist;From bed;With upper extremity assist;From elevated surface Stand to Sit: 4: Min assist;To chair/3-in-1;Without upper extremity assist;With armrests Stand to Sit Details: cues for use of UEs Ambulation/Gait Ambulation/Gait Assistance: 4: Min assist Ambulation/Gait Assistance Details (indicate cue type and reason): cues for position from RW Ambulation Distance (Feet): 300 Feet Assistive device: Rolling walker Gait Pattern: Step-through pattern    Exercise  Total Joint Exercises Ankle Circles/Pumps: AROM;Both;20 reps Gluteal Sets: Both;10 reps;5 reps;Supine;AROM Heel Slides: 20 reps;Both;AAROM;AROM;Supine Hip ABduction/ADduction: AROM;AAROM;20  reps;Supine;Right;Left Long Arc Quad: AROM;20 reps;Seated;Both End of Session PT - End of Session Activity Tolerance: Patient tolerated treatment well Patient left: in chair;with call bell in reach;with family/visitor present Nurse Communication: Mobility status for transfers;Mobility status for ambulation General Behavior During Session: [facility] for tasks performed Cognition: [facility] for tasks performed  Jeyda Siebel 10/23/2011, 12:03 PM

## 2011-10-23 NOTE — Progress Notes (Signed)
10/23/2011 Raynelle Bring BSN CCM [phone] CM spoke with patient and spouse. Plans are for patient to return to his home in [place], [place] where spouse will be caregiver. Pt will need rw and 3n1. 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 [facility] -rep notified of need for hhpt and Dme. She will call bck with start of care.

## 2011-10-23 NOTE — Progress Notes (Signed)
Physical Therapy Treatment Patient Details Name: Derel Mcglasson MRN: [id number] DOB: [birth year] Today's Date: 10/23/2011 1340 - 1404; 2GT PT Assessment/Plan  PT - Assessment/Plan PT Plan: Discharge plan remains appropriate PT Frequency: 7X/week Follow Up Recommendations: Home health PT Equipment Recommended: Rolling walker with 5" wheels PT Goals  Acute Rehab PT Goals Time For Goal Achievement: 7 days Pt will go Supine/Side to Sit: with supervision PT Goal: Supine/Side to Sit - Progress: Progressing toward goal Pt will go Sit to Supine/Side: with supervision PT Goal: Sit to Supine/Side - Progress: Progressing toward goal Pt will go Sit to Stand: with supervision PT Goal: Sit to Stand - Progress: Progressing toward goal Pt will go Stand to Sit: with supervision PT Goal: Stand to Sit - Progress: Progressing toward goal Pt will Ambulate: 51 - 150 feet;with supervision;with rolling walker PT Goal: Ambulate - Progress: Progressing toward goal Pt will Go Up / Down Stairs: 3-5 stairs;with min assist;with least restrictive assistive device PT Goal: Up/Down Stairs - Progress: Progressing toward goal  PT Treatment Precautions/Restrictions  Precautions Precautions: Anterior Hip;Fall Restrictions Weight Bearing Restrictions: No Other Position/Activity Restrictions: WBAT Mobility (including Balance) Bed Mobility Bed Mobility:  (Pt declined back to bed) Transfers Sit to Stand: 5: Supervision;4: Min assist;With armrests;With upper extremity assist;From chair/3-in-1 Stand to Sit: 5: Supervision;4: Min assist;To chair/3-in-1;With armrests;With upper extremity assist Ambulation/Gait Ambulation/Gait Assistance: 5: Supervision;4: Min assist Ambulation/Gait Assistance Details (indicate cue type and reason): cues for position from RW Ambulation Distance (Feet): 400 Feet Assistive device: Rolling walker Gait Pattern: Step-through pattern Stairs: Yes Stairs Assistance: 4: Min assist Stairs  Assistance Details (indicate cue type and reason): cues for sequence and foot/crutch placement Stair Management Technique: Step to pattern;One rail Left;Forwards;With crutches Number of Stairs: 4  (performed 3x up/down 4 steps)    Exercise    End of Session PT - End of Session Equipment Utilized During Treatment: Gait belt Activity Tolerance: Patient tolerated treatment well Patient left: in chair;with call bell in reach;with family/visitor present Nurse Communication: Mobility status for transfers;Mobility status for ambulation General Behavior During Session: [facility] for tasks performed Cognition: [facility] for tasks performed  Divya Munshi 10/23/2011, 2:24 PM

## 2011-10-23 NOTE — Progress Notes (Signed)
Subjective: 2 Days Post-Op Procedure(s) (LRB): BILATERAL ANTERIOR TOTAL HIP ARTHROPLASTY (Bilateral)   Patient reports pain as mild. No events. Feeling better after receiving blood.  Objective:   VITALS:   Filed Vitals:   10/23/11 0512  BP: 98/54  Pulse: 49  Temp: 98.3 F (36.8 C)  Resp: 16    Neurovascular intact Dorsiflexion/Plantar flexion intact Incision: dressing C/D/I No cellulitis present Compartment soft  LABS  Basename 10/23/11 0336 10/22/11 0421 10/21/11 1446  HGB 9.3* 8.1* 8.9*  HCT 27.5* 24.5* 27.4*  WBC 10.0 7.2 --  PLT 155 171 --     Basename 10/23/11 0336 10/22/11 0421  NA 135 138  K 4.3 3.7  BUN 15 19  CREATININE 1.08 1.24  GLUCOSE 175* 132*     Assessment/Plan: 2 Days Post-Op Procedure(s) (LRB): BILATERAL ANTERIOR TOTAL HIP ARTHROPLASTY (Bilateral)   Up with therapy Plan for discharge tomorrow to home with HHPT   Anastasio Auerbach. Keylin Ferryman   PAC  10/23/2011, 8:11 AM

## 2011-10-23 NOTE — Discharge Summary (Signed)
Physician Discharge Summary  Patient ID: Nathaniel Mejia MRN: [id number] DOB/AGE: [birth year] 61 y.o.  Admit date: 10/21/2011 Discharge date: 10/24/2011  Procedures:  Procedure(s) (LRB): BILATERAL ANTERIOR TOTAL HIP ARTHROPLASTY (Bilateral)  Attending Physician: Shelda Pal   Admission Diagnoses: Bilateral hip OA and pain    Discharge Diagnoses:  Principal Problem:  *S/P bilateral hip replacements Arthritis Hyperlipidemia  HPI: Pt is a 60 y.o. male complaining of bilateral hip pain for about a year. Pain had continually increased since the beginning. He originally thought the pain was coming from his back, after being worked up for back issues the pain eventually settled into the hips. X-rays in the clinic show end-stage arthritic changes of bilateral hips. Pt has tried various conservative treatments which have failed to alleviate their symptoms. Various options are discussed with the patient. Risks, benefits and expectations were discussed with the patient. Patient understand the risks, benefits and expectations and wishes to proceed with surgery.   PCP: No primary provider on file.   Discharged Condition: good  Hospital Course:  Patient underwent the above stated procedure on 10/21/2011. Patient tolerated the procedure well and brought to the recovery room in good condition and subsequently to the floor.  POD #1 BP: 82/38 ; Pulse: 58 ; Temp: 98.2 F Pt's foley was removed, as well as the hemovac drain removed. IV was changed to a saline lock. Patient reports pain as mild. No events. Pt's BP was a little low, however he states that this is normal for him. However after feeling dizzy and tired it was decided to transfuse with 2 units of PRBC for the acute blood loss anemia. Neurovascular intact, dorsiflexion/plantar flexion intact, incision: dressing C/D/I, no cellulitis present and compartment soft.  LABS  Basename  10/22/11 0421    HGB  8.1*    HCT  24.5*     POD #2  BP:  98/54 ; Pulse: 49 ; Temp: 98.3 F Patient reports pain as mild. No events. Feeling better after receiving blood. Neurovascular intact, dorsiflexion/plantar flexion intact, incision: dressing C/D/I, no cellulitis present and compartment soft.  LABS  Basename  10/23/11 0336     HGB  9.3*     HCT  27.5*      POD #3  BP: 98/52 ; Pulse: 66 ; Temp: 100.2 F Patient reports pain as mild. No events. Ready to be discharged home.  Neurovascular intact, dorsiflexion/plantar flexion intact, incision: dressing C/D/I, no cellulitis present and compartment soft.  LABS   No new labs  Discharge Exam: Extremities: Homans sign is negative, no sign of DVT, no edema, redness or tenderness in the calves or thighs and no ulcers, gangrene or trophic changes  Disposition: Home or Self Care with HHPT. Follow up at [facility] in 2 weeks.  Discharge Orders    Future Orders Please Complete By Expires   Diet - low sodium heart healthy      Call MD / Call 911      Comments:   If you experience chest pain or shortness of breath, CALL 911 and be transported to the hospital emergency room.  If you develope a fever above 101 F, pus (white drainage) or increased drainage or redness at the wound, or calf pain, call your surgeon's office.   Discharge instructions      Comments:   Maintain surgical dressing for 8 days, then replace with gauze and tape. Keep the area dry and clean until follow up. Follow up in 2 weeks at [facility]. Call  with any questions or concerns.     Constipation Prevention      Comments:   Drink plenty of fluids.  Prune juice may be helpful.  You may use a stool softener, such as Colace (over the counter) 100 mg twice a day.  Use MiraLax (over the counter) for constipation as needed.   Increase activity slowly as tolerated      Weight Bearing as taught in Physical Therapy      Comments:   Use a walker or crutches as instructed.   Driving restrictions      Comments:    No driving for 4 weeks   Change dressing      Comments:   Maintain surgical dressing for 8 days, the replace with 4x4 guaze and tape.  Keep the area dry and clean .   TED hose      Comments:   Use stockings (TED hose) for 2 weeks on both leg(s).  You may remove them at night for sleeping.      Current Discharge Medication List    START taking these medications   Details  aspirin EC 325 MG tablet Take 1 tablet (325 mg total) by mouth 2 (two) times daily. X 4 weeks Qty: 60 tablet, Refills: 0    diphenhydrAMINE (BENADRYL) 25 mg capsule Take 1 capsule (25 mg total) by mouth every 6 (six) hours as needed for itching, allergies or sleep.    docusate sodium 100 MG CAPS Take 100 mg by mouth 2 (two) times daily.    ferrous sulfate 325 (65 FE) MG tablet Take 1 tablet (325 mg total) by mouth 3 (three) times daily after meals.    HYDROcodone-acetaminophen (NORCO) 5-325 MG per tablet Take 1-2 tablets by mouth every 4 (four) hours as needed for pain. Qty: 60 tablet, Refills: 0    methocarbamol (ROBAXIN) 500 MG tablet Take 1 tablet (500 mg total) by mouth every 6 (six) hours as needed.    polyethylene glycol (MIRALAX / GLYCOLAX) packet Take 17 g by mouth 2 (two) times daily.      CONTINUE these medications which have NOT CHANGED   Details  atorvastatin (LIPITOR) 20 MG tablet Take 20 mg by mouth every morning.     sertraline (ZOLOFT) 100 MG tablet Take 150 mg by mouth every morning. Take 1 1/2  tablets by mouth once daily      STOP taking these medications     acetaminophen (TYLENOL) 325 MG tablet Comments:  Reason for Stopping:       aspirin 81 MG tablet Comments:  Reason for Stopping:       ibuprofen (ADVIL,MOTRIN) 200 MG tablet Comments:  Reason for Stopping:          Signed:   Anastasio Auerbach. Tedd Cottrill   PAC  10/24/2011, 8:22 AM

## 2011-10-23 NOTE — Op Note (Signed)
NAME:  Draylen Lobue                ACCOUNT NO.: [id number]      MEDICAL RECORD NO.: [id number]      FACILITY:  [facility]      PHYSICIAN:  Durene Romans D  DATE OF BIRTH:  [birth year]     DATE OF PROCEDURE:  10/23/2011                                 OPERATIVE REPORT         PREOPERATIVE DIAGNOSIS: Bilateral  hip osteoarthritis.      POSTOPERATIVE DIAGNOSIS:  Bilateral osteoarthritis.      PROCEDURE:  Bilateral total hip replacement through an anterior approach   utilizing DePuy THR system, component size on the right 54 pinnacle cup, a size 36+4 neutral   Altrex liner, a size 9 standard Tri Lock stem with a 36+5 delta ceramic   ball, and on the left a 54 pinnacle cup, a size 36+4 neutral   Altrex liner, a size 9 standard Tri Lock stem with a 36+1.5 delta ceramic   ball  .      SURGEON:  Madlyn Frankel. Charlann Boxer, M.D.      ASSISTANT:  Lanney Gins, PA      ANESTHESIA:  MAC and Epidural.      SPECIMENS:  None.      COMPLICATIONS:  None.      BLOOD LOSS:  1200 cc total for both hips     DRAINS:  One Hemovac per hip.      INDICATION OF THE PROCEDURE:  Nathaniel Mejia is a 60 y.o. male who had   presented to office for evaluation of bilateral hip pain.  Radiographs revealed   progressive degenerative changes with bone-on-bone   articulation to the  hip joint.  The patient had painful limited range of   motion significantly affecting their overall quality of life.  The patient was failing to    respond to conservative measures, and at this point was ready   to proceed with more definitive measures.  The patient has noted progressive   degenerative changes in his hip, progressive problems and dysfunction   with regarding the hip prior to surgery.  Consent was obtained for   benefit of pain relief.  Specific risk of infection, DVT, component   failure, dislocation, need for revision surgery, as well discussion of   the anterior versus posterior approach were reviewed.  Consent was   obtained for benefit of anterior pain relief through an anterior   approach.      PROCEDURE IN DETAIL:  The patient was brought to operative theater.   Once adequate anesthesia, preoperative antibiotics, 2 gm Ancef administered.   The patient was positioned supine on the OSI Hanna table.  Once adequate   padding of boney process was carried out, we had predraped out both hips, and  used fluoroscopy to confirm orientation of the pelvis and position.      The both hips were then prepped and draped from proximal iliac crest to   mid thigh with shower curtain technique for simultaneous procedure.      Time-out was performed identifying the patient, planned procedure, and   extremities.  First procedure was the right hip, left hip second.     An incision was then made 2 cm distal and lateral to the   anterior superior  iliac spine extending over the orientation of the   tensor fascia lata muscle and sharp dissection was carried down to the   fascia of the muscle and protractor placed in the soft tissues.      The fascia was then incised.  The muscle belly was identified and swept   laterally and retractor placed along the superior neck.  Following   cauterization of the circumflex vessels and removing some pericapsular   fat, a second cobra retractor was placed on the inferior neck.  A third   retractor was placed on the anterior acetabulum after elevating the   anterior rectus.  A L-capsulotomy was along the line of the   superior neck to the trochanteric fossa, then extended proximally and   distally.  Tag sutures were placed and the retractors were then placed   intracapsular.  We then identified the trochanteric fossa and   orientation of my neck cut, confirmed this radiographically   and then made a neck osteotomy with the femur on traction.  The femoral   head was removed without difficulty or complication.  Traction was let   off and retractors were placed posterior and anterior  around the   acetabulum.      The labrum and foveal tissue were debrided.  I began reaming with a 48   reamer and reamed up to 53 reamer with good bony bed preparation and a 54   cup was chosen.  The final 54 Pinnacle cup was then impacted under fluoroscopy  to confirm the depth of penetration and orientation with respect to   abduction.  A screw was placed followed by the hole eliminator.  The final   36+4 Altrex liner was impacted with good visualized rim fit.  The cup was positioned anatomically within the acetabular portion of the pelvis.      At this point, the femur was rolled at 80 degrees.  Further capsule was   released off the inferior aspect of the femoral neck.  I then   released the superior capsule proximally.  The hook was placed laterally   along the femur and elevated manually and held in position with the bed   hook.  The leg was then extended and adducted with the leg rolled to 100   degrees of external rotation.  Once the proximal femur was fully   exposed, I used a box osteotome to set orientation.  I then began   broaching with the starting chili pepper broach and passed this by hand and then broached up to 9.  With the 9 broach in place I chose a standard neck and a 36+5 trial head and did a trial reduction.  The offset was appropriate, leg lengths   appeared to be equal, confirmed radiographically.   Given these findings, I went ahead and dislocated the hip, repositioned all   retractors and positioned the right hip in the extended and abducted position.  The final 9 standard  Tri Lock stem was   chosen and it was impacted down to the level of neck cut.  Based on this   and the trial reduction, a 36+5 delta ceramic ball was chosen and   impacted onto a clean and dry trunnion, and the hip was reduced.  The   hip had been irrigated throughout the case again at this point.  I did   reapproximate the superior capsular leaflet to the anterior leaflet   using #1 Vicryl,  placed a medium Hemovac  drain deep.  The fascia of the   tensor fascia lata muscle was then reapproximated using #1 Vicryl.  The   remaining wound was closed with 2-0 Vicryl and running 4-0 Monocryl.   The hip was cleaned, dried, and dressed sterilely using Dermabond and   Aquacel dressing.  Drain site dressed separately.    While the right hip was being closed and dressed I the switched my attention to the left hip.  An incision was then made 2 cm distal and lateral to the   anterior superior iliac spine extending over the orientation of the   tensor fascia lata muscle and sharp dissection was carried down to the   fascia of the muscle and protractor placed in the soft tissues.      The fascia was then incised.  The muscle belly was identified and swept   laterally and retractor placed along the superior neck.  Following   cauterization of the circumflex vessels and removing some pericapsular   fat, a second cobra retractor was placed on the inferior neck.  A third   retractor was placed on the anterior acetabulum after elevating the   anterior rectus.  A L-capsulotomy was along the line of the   superior neck to the trochanteric fossa, then extended proximally and   distally.  Tag sutures were placed and the retractors were then placed   intracapsular.  We then identified the trochanteric fossa and   orientation of my neck cut, confirmed this radiographically   and then made a neck osteotomy with the femur on traction.  The femoral   head was removed without difficulty or complication.  Traction was let   off and retractors were placed posterior and anterior around the   acetabulum.      The labrum and foveal tissue were debrided.  I began reaming with a 48   reamer and reamed up to 53 reamer with good bony bed preparation and a 54   cup was chosen.  The final 54 Pinnacle cup was then impacted under fluoroscopy  to confirm the depth of penetration and orientation with respect to    abduction.  A screw was placed followed by the hole eliminator.  The final   36+4 neutral Altrex liner was impacted with good visualized rim fit.  The cup was positioned anatomically within the acetabular portion of the pelvis.      At this point, the femur was rolled at 80 degrees.  Further capsule was   released off the inferior aspect of the femoral neck.  I then   released the superior capsule proximally.  The hook was placed laterally   along the femur and elevated manually and held in position with the bed   hook.  The leg was then extended and adducted with the leg rolled to 100   degrees of external rotation.  Once the proximal femur was fully   exposed, I used a box osteotome to set orientation.  I then began   broaching with the starting chili pepper broach and passed this by hand and then broached up to 9.  With the 9 broach in place I chose a standard neck as with the contralateral hip but with a 36+1.5 head and did a trial reduction.  The offset was appropriate, leg lengths   appeared to be equal, confirmed radiographically particularly comparing to the right hip just performed.    Given these findings, I went ahead and dislocated the hip, repositioned all  retractors and positioned the right hip in the extended and abducted position.  The final 9 standard  Tri Lock stem was   chosen and it was impacted down to the level of neck cut.  Based on this   and the trial reduction, a 36+1.5 delta ceramic ball was chosen and   impacted onto a clean and dry trunnion, and the hip was reduced.  The   hip had been irrigated throughout the case again at this point.  I did   reapproximate the superior capsular leaflet to the anterior leaflet   using #1 Vicryl, placed a medium Hemovac drain deep.  The fascia of the   tensor fascia lata muscle was then reapproximated using #1 Vicryl.  The   remaining wound was closed with 2-0 Vicryl and running 4-0 Monocryl.   The left hip was then cleaned,  dried, and dressed sterilely using Dermabond and   Aquacel dressing.  Drain site dressed separately.    He was then brought  to recovery room in stable condition tolerating the procedures well.     Lanney Gins was present for the entirety of the case involved from   preoperative positioning, perioperative retractor management, general   facilitation of the case, as well as primary wound closure as assistant.            Madlyn Frankel Charlann Boxer, M.D.            MDO/MEDQ  D:  08/26/2011  T:  08/26/2011  Job:  [id number]      Electronically Signed by Durene Romans M.D. on 09/01/2011 09:15:38 AM

## 2011-10-24 MED ORDER — HYDROCODONE-ACETAMINOPHEN 5-325 MG PO TABS: 1.0000 | ORAL_TABLET | ORAL | Status: AC | PRN

## 2011-10-24 NOTE — Progress Notes (Signed)
Subjective: 3 Days Post-Op Procedure(s) (LRB): BILATERAL ANTERIOR TOTAL HIP ARTHROPLASTY (Bilateral)   Patient reports pain as mild. No events. Ready to be discharged home.  Objective:   VITALS:   Filed Vitals:   10/24/11 0440  BP: 98/52  Pulse: 66  Temp: 100.2 F (37.9 C)  Resp: 14    Neurovascular intact Dorsiflexion/Plantar flexion intact Incision: dressing C/D/I No cellulitis present Compartment soft  LABS No new labs  Assessment/Plan: 3 Days Post-Op Procedure(s) (LRB): BILATERAL ANTERIOR TOTAL HIP ARTHROPLASTY (Bilateral)   Up with therapy Discharge home with home health today   Nathaniel Mejia. Nathaniel Mejia   PAC  10/24/2011, 8:14 AM

## 2011-10-24 NOTE — Progress Notes (Signed)
Physical Therapy Treatment Patient Details Name: Nathaniel Mejia MRN: [id number] DOB: 1951-08-17 Today's Date: 10/24/2011 0840 - 0900; TA PT Assessment/Plan  PT - Assessment/Plan PT Plan: Discharge plan remains appropriate PT Frequency: 7X/week Follow Up Recommendations: Home health PT Equipment Recommended: Rolling walker with 5" wheels PT Goals  Acute Rehab PT Goals Time For Goal Achievement: 7 days Pt will go Supine/Side to Sit: with supervision PT Goal: Supine/Side to Sit - Progress: Met Pt will go Sit to Supine/Side: with supervision PT Goal: Sit to Supine/Side - Progress: Met Pt will go Sit to Stand: with supervision PT Goal: Sit to Stand - Progress: Met Pt will go Stand to Sit: with supervision PT Goal: Stand to Sit - Progress: Met Pt will Ambulate: 51 - 150 feet;with supervision;with rolling walker PT Goal: Ambulate - Progress: Met Pt will Go Up / Down Stairs: 3-5 stairs;with min assist;with least restrictive assistive device PT Goal: Up/Down Stairs - Progress: Met  PT Treatment Precautions/Restrictions  Precautions Precautions: Anterior Hip;Fall Restrictions Weight Bearing Restrictions: No Other Position/Activity Restrictions: WBAT Mobility (including Balance) Bed Mobility Supine to Sit: 5: Supervision Sit to Supine - Right: 5: Supervision Transfers Sit to Stand: 5: Supervision Stand to Sit: 5: Supervision Ambulation/Gait Ambulation/Gait Assistance: 5: Supervision Ambulation Distance (Feet):  (IND in halls with RW) Assistive device: Rolling walker Gait Pattern: Step-through pattern Stairs:  (Pt states he feels comfortable with stairs)   Reviewed car transfers with pt and spouse Exercise  Total Joint Exercises Hip ABduction/ADduction: Standing;AROM;10 reps Knee Flexion: AROM;10 reps;Standing;Both Marching in Standing: Both;10 reps;Standing;AROM Standing Hip Extension: AROM;Both;10 reps;Standing End of Session PT - End of Session Activity Tolerance:  Patient tolerated treatment well Patient left: in chair;with call bell in reach;with family/visitor present Nurse Communication: Mobility status for transfers;Mobility status for ambulation General Behavior During Session: [facility] for tasks performed Cognition: [facility] for tasks performed  Brinn Westby 10/24/2011, 12:37 PM

## 2011-12-26 ENCOUNTER — Other Ambulatory Visit: Payer: Self-pay | Admitting: Internal Medicine

## 2012-03-27 ENCOUNTER — Other Ambulatory Visit: Payer: Self-pay | Admitting: Internal Medicine

## 2012-08-18 ENCOUNTER — Other Ambulatory Visit (INDEPENDENT_AMBULATORY_CARE_PROVIDER_SITE_OTHER): Payer: 59 | Admitting: Internal Medicine

## 2012-08-18 DIAGNOSIS — Z2911 Encounter for prophylactic immunotherapy for respiratory syncytial virus (RSV): Secondary | ICD-10-CM

## 2012-08-18 DIAGNOSIS — Z0000 Encounter for general adult medical examination without abnormal findings: Secondary | ICD-10-CM

## 2012-08-19 ENCOUNTER — Other Ambulatory Visit: Payer: Self-pay | Admitting: Internal Medicine

## 2012-10-22 ENCOUNTER — Other Ambulatory Visit: Payer: Self-pay | Admitting: Internal Medicine

## 2012-10-22 MED ORDER — ATORVASTATIN CALCIUM 20 MG PO TABS: 20.0000 mg | ORAL_TABLET | Freq: Every day | ORAL | Status: DC

## 2012-10-22 MED ORDER — SERTRALINE HCL 100 MG PO TABS: ORAL_TABLET | ORAL | Status: DC

## 2012-11-01 ENCOUNTER — Other Ambulatory Visit: Payer: Self-pay | Admitting: *Deleted

## 2012-11-01 MED ORDER — SERTRALINE HCL 100 MG PO TABS: 200.0000 mg | ORAL_TABLET | Freq: Every day | ORAL | Status: DC

## 2012-11-01 NOTE — Telephone Encounter (Signed)
I have called and spoken to Dorina Hoyer, Pharmacist at [facility] and have given her a verbal order for sertraline 100 mg, 2 tablets by mouth once daily #180 with 2 refills since apparently an rx was never received. I have also sent another electronic rx for sertraline just to make certain the medication gets to patient.

## 2012-11-18 ENCOUNTER — Other Ambulatory Visit: Payer: Self-pay | Admitting: Dermatology

## 2013-04-16 IMAGING — CR DG PORTABLE PELVIS
1 series · 1 of 1 positions shown · non-contrast
Comparison: 07/22/2011

CLINICAL DATA: Bilateral hip replacement surgery

PORTABLE PELVIS

[AP]
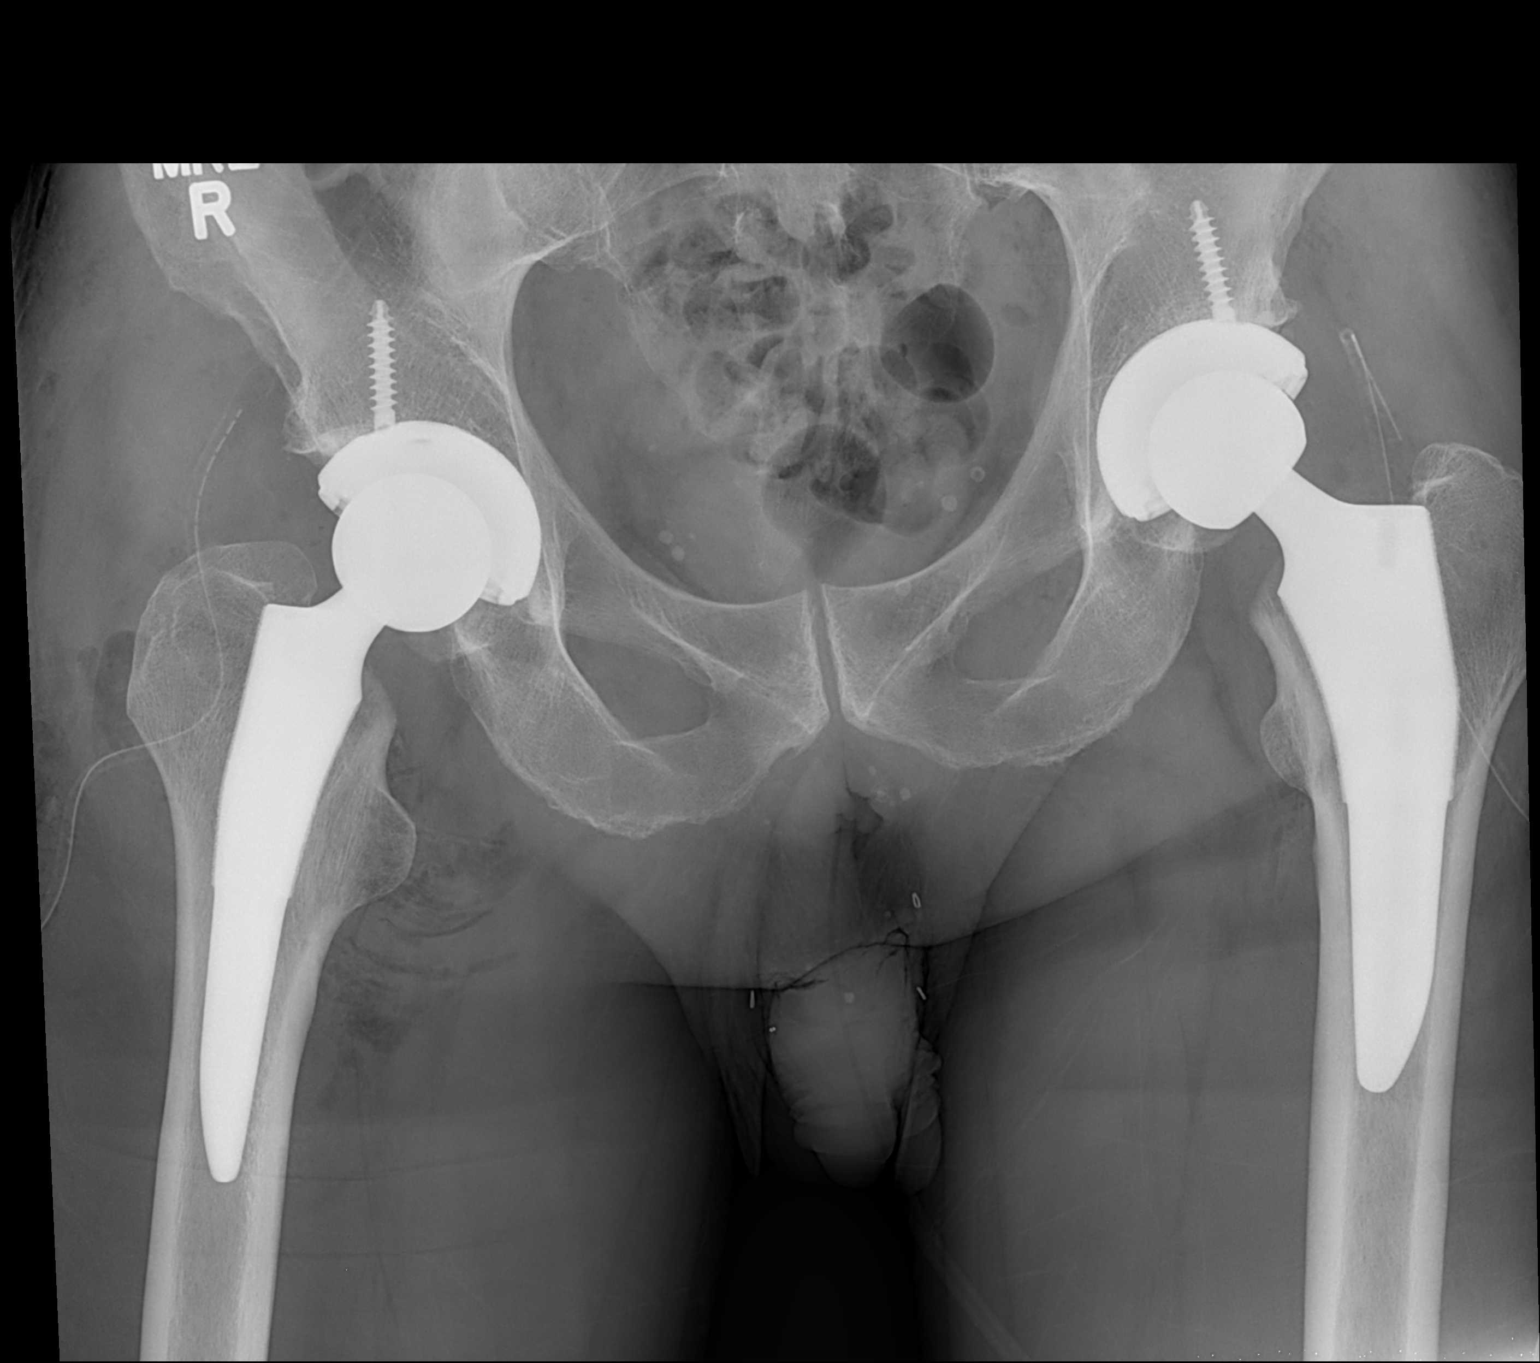

[1 of 1 positions shown; findings below may reference images not displayed]

FINDINGS: Changes of bilateral hip arthroplasty with noncemented
femoral and acetabular components projecting in expected location.
Negative for fracture or dislocation.  Surgical drains project
laterally.  Bilateral pelvic vascular calcifications.
IMPRESSION: 1.  Bilateral hip arthroplasty without apparent complication.

## 2013-04-19 ENCOUNTER — Other Ambulatory Visit: Payer: Self-pay

## 2013-04-19 MED ORDER — SERTRALINE HCL 100 MG PO TABS: 200.0000 mg | ORAL_TABLET | Freq: Every day | ORAL | Status: DC

## 2013-04-19 NOTE — Telephone Encounter (Signed)
Message    OK to refill   ----- Message -----   From: Jessee Avers, CMA   Sent: 04/19/2013 10:26 AM   To: Hart Carwin, MD      Can Dr. Arlyce Dice have a refill of his Zoloft? Patient is requesting.

## 2013-05-17 ENCOUNTER — Telehealth: Payer: Self-pay | Admitting: *Deleted

## 2013-05-17 MED ORDER — ATORVASTATIN CALCIUM 20 MG PO TABS: 20.0000 mg | ORAL_TABLET | Freq: Every day | ORAL | Status: DC

## 2013-05-17 NOTE — Telephone Encounter (Signed)
Patient requests rx for Lipitor be sent to his pharmacy. Rx sent per Dr Regino Schultze verbal okay.

## 2013-08-26 ENCOUNTER — Other Ambulatory Visit: Payer: Self-pay | Admitting: *Deleted

## 2013-08-26 MED ORDER — ATORVASTATIN CALCIUM 20 MG PO TABS: 20.0000 mg | ORAL_TABLET | Freq: Every day | ORAL | Status: DC

## 2013-08-26 NOTE — Telephone Encounter (Signed)
Patient requests refill on Lipitor. Per Dr [name], patient may have rx.

## 2013-10-12 ENCOUNTER — Telehealth: Payer: Self-pay | Admitting: *Deleted

## 2013-10-12 DIAGNOSIS — D649 Anemia, unspecified: Secondary | ICD-10-CM

## 2013-10-12 DIAGNOSIS — E785 Hyperlipidemia, unspecified: Secondary | ICD-10-CM

## 2013-10-12 DIAGNOSIS — F32A Depression, unspecified: Secondary | ICD-10-CM

## 2013-10-12 DIAGNOSIS — F329 Major depressive disorder, single episode, unspecified: Secondary | ICD-10-CM

## 2013-10-12 MED ORDER — SERTRALINE HCL 100 MG PO TABS: 150.0000 mg | ORAL_TABLET | Freq: Every day | ORAL | Status: DC

## 2013-10-12 NOTE — Telephone Encounter (Signed)
Per Dr [name] patient has been advised that he needs to have labwork completed. Labs have been entered into EPIC. Zoloft refills have been sent to patient's mail in pharmacy per patient request.

## 2013-10-12 NOTE — Telephone Encounter (Signed)
Refill Zoloft 100mg , , take 1 1/2 tabs po qd, # 135, 3 refills

## 2013-10-12 NOTE — Telephone Encounter (Signed)
Please call pt to update CBC, c-met, HgbA1C, last done 2 years ago.Please put labs in the computer

## 2013-10-12 NOTE — Telephone Encounter (Signed)
Dr [name], patient requests refills on Zoloft 100 mg tablets. He takes 1.5 tablets (150 mg) daily and would like a 90 day supply. May I fill Zoloft 100 mg, take 1.5 tablets daily # 135 with 3 refills?

## 2013-10-14 ENCOUNTER — Other Ambulatory Visit (INDEPENDENT_AMBULATORY_CARE_PROVIDER_SITE_OTHER): Payer: 59

## 2013-10-14 ENCOUNTER — Telehealth: Payer: Self-pay | Admitting: *Deleted

## 2013-10-14 DIAGNOSIS — Z0000 Encounter for general adult medical examination without abnormal findings: Secondary | ICD-10-CM

## 2013-10-14 DIAGNOSIS — F329 Major depressive disorder, single episode, unspecified: Secondary | ICD-10-CM

## 2013-10-14 DIAGNOSIS — E785 Hyperlipidemia, unspecified: Secondary | ICD-10-CM

## 2013-10-14 DIAGNOSIS — D649 Anemia, unspecified: Secondary | ICD-10-CM

## 2013-10-14 LAB — CBC WITH DIFFERENTIAL/PLATELET
Eosinophils Relative: 2.4 % (ref 0.0–5.0)
HCT: 41.6 % (ref 39.0–52.0)
Hemoglobin: 13.9 g/dL (ref 13.0–17.0)
Lymphs Abs: 2.4 10*3/uL (ref 0.7–4.0)
MCHC: 33.3 g/dL (ref 30.0–36.0)
MCV: 91.6 fl (ref 78.0–100.0)
Monocytes Absolute: 0.6 10*3/uL (ref 0.1–1.0)
Monocytes Relative: 8.8 % (ref 3.0–12.0)
Neutro Abs: 3.6 10*3/uL (ref 1.4–7.7)
Platelets: 261 10*3/uL (ref 150.0–400.0)
WBC: 6.8 10*3/uL (ref 4.5–10.5)

## 2013-10-14 LAB — COMPREHENSIVE METABOLIC PANEL
Alkaline Phosphatase: 66 U/L (ref 39–117)
BUN: 25 mg/dL — ABNORMAL HIGH (ref 6–23)
Calcium: 10.3 mg/dL (ref 8.4–10.5)
GFR: 59.49 mL/min — ABNORMAL LOW (ref >60.00)
Glucose, Bld: 86 mg/dL (ref 70–99)
Sodium: 139 mEq/L (ref 135–145)
Total Bilirubin: 0.8 mg/dL (ref 0.3–1.2)

## 2013-10-14 LAB — HEMOGLOBIN A1C: Hgb A1c MFr Bld: 6 % (ref 4.6–6.5)

## 2013-10-14 LAB — PSA: PSA: 1.17 ng/mL (ref 0.10–4.00)

## 2013-10-14 NOTE — Telephone Encounter (Signed)
Message copied by Richardson Chiquito on Fri Oct 14, 2013  1:21 PM ------      Message from: Hart Carwin      Created: Fri Oct 14, 2013 12:38 PM      Regarding: PSA       Dotti, please add PSA to Dr Nita Sells labs, he will have it drawn today. ------

## 2013-11-15 ENCOUNTER — Other Ambulatory Visit: Payer: Self-pay | Admitting: *Deleted

## 2013-11-15 NOTE — Telephone Encounter (Signed)
Patient requests refills on Lipitor. However, her has enough refills until 08/26/14. I have contacted [facility] and have spoken to [place]. Caryn BeeKevin has scheduled refill to be sent out to patient.

## 2014-07-19 ENCOUNTER — Other Ambulatory Visit: Payer: Self-pay | Admitting: Internal Medicine

## 2014-07-19 NOTE — Telephone Encounter (Signed)
Rx sent 

## 2014-07-19 NOTE — Telephone Encounter (Signed)
Message copied by Richardson Chiquito on Wed Jul 19, 2014  1:31 PM ------      Message from: Hart Carwin      Created: Wed Jul 19, 2014 12:04 PM       OK to refill Lipitor      ----- Message -----         From: Richardson Chiquito, CMA         Sent: 07/19/2014  11:29 AM           To: Hart Carwin, MD            Dr [name]-      May I continue to fill Dr Marzetta Board Lipitor?       ------

## 2014-09-06 ENCOUNTER — Telehealth: Payer: Self-pay | Admitting: *Deleted

## 2014-09-06 MED ORDER — SERTRALINE HCL 100 MG PO TABS: 150.0000 mg | ORAL_TABLET | Freq: Every day | ORAL | Status: DC

## 2014-09-06 NOTE — Telephone Encounter (Signed)
-----   Message from Hart Carwinora M Brodie, MD sent at 09/06/2014  1:11 PM EST ----- Regarding: new prescription Dotti, please send prescription for Welbutrin XL 150mg , #90, 1 po qd, 3 refills. To [facility] Thanx

## 2014-09-18 ENCOUNTER — Telehealth: Payer: Self-pay | Admitting: *Deleted

## 2014-09-18 MED ORDER — BUPROPION HCL ER (XL) 150 MG PO TB24: 150.0000 mg | ORAL_TABLET | Freq: Every day | ORAL | Status: DC

## 2014-09-18 NOTE — Telephone Encounter (Signed)
Patient states that rx for wellbutrin is not available at [facility]. I have sent a new prescription as previously zoloft refill was sent in error. I have [facility] who confirms that new rx was received and zoloft rx will be d/c'ed.

## 2014-12-14 ENCOUNTER — Other Ambulatory Visit: Payer: Self-pay | Admitting: *Deleted

## 2014-12-14 MED ORDER — SERTRALINE HCL 100 MG PO TABS: ORAL_TABLET | ORAL | Status: DC

## 2014-12-14 MED ORDER — AMOXICILLIN 500 MG PO TABS: ORAL_TABLET | ORAL | Status: DC

## 2015-04-16 ENCOUNTER — Encounter: Payer: Self-pay | Admitting: Family Medicine

## 2015-04-16 ENCOUNTER — Other Ambulatory Visit: Payer: Self-pay | Admitting: Family Medicine

## 2015-04-16 DIAGNOSIS — S29011A Strain of muscle and tendon of front wall of thorax, initial encounter: Secondary | ICD-10-CM | POA: Insufficient documentation

## 2015-04-16 MED ORDER — NITROGLYCERIN 0.2 MG/HR TD PT24: MEDICATED_PATCH | TRANSDERMAL | Status: DC

## 2015-04-16 NOTE — Progress Notes (Signed)
[name] [address] [place], [place] [address] Phone: [phone] Subjective:     CC: right Shoulder.  Nathaniel Mejia is a 64 y.o. male coming in with complaint of right shoulder pain. 2 weeks ago patient was waterskiing and had an extension injury. Patient had significant bruising going down his arm immediately. Patient had a lot of pain. Patient seemed to be getting better and then unfortunately he started doing another activity when he was trying to shift his car hurt an audible pop and had significant pain as well as recurrent bruising. Patient states though that the pain didn't seem to resolve. This was approximately 3 days ago. Patient since then has taken ibuprofen because he is having more of a shoulder pain. Describes it as a dull aching sensation. Denies any numbness down the hand or any weakness. Patient states though that if he tries to do any pushing it is not as strong as his other side. Rates the severity of discomfort as 5 out of 10. Patient does have a vacation and he was to be very active.  Past Medical History  Diagnosis Date  . Arthritis     bilateral hip oa and pain  . Hyperlipidemia    Past Surgical History  Procedure Laterality Date  . Transurethral resection of prostate  2010    Dr Retta Diones  . Appendectomy    . Vasectomy    . Colonoscopy      adenomatous polyp 2012   History  Substance Use Topics  . Smoking status: Current Some Day Smoker    Types: Pipe  . Smokeless tobacco: Not on file  . Alcohol Use: Yes     Comment: rarely   No Known Allergies Family History  Problem Relation Age of Onset  . Cancer Father 4    colon  . Heart failure Mother     valvular heart disease  . COPD Mother        Past medical history, social, surgical and family history all reviewed in electronic medical record.   Review of Systems: No headache, visual changes, nausea, vomiting, diarrhea, constipation, dizziness,  abdominal pain, skin rash, fevers, chills, night sweats, weight loss, swollen lymph nodes, body aches, joint swelling, muscle aches, chest pain, shortness of breath, mood changes.   Objective There were no vitals taken for this visit.  General: No apparent distress alert and oriented x3 mood and affect normal, dressed appropriately.  HEENT: Pupils equal, extraocular movements intact  Respiratory: Patient's speak in full sentences and does not appear short of breath  Cardiovascular: No lower extremity edema, non tender, no erythema  Skin: Warm dry intact with no signs of infection or rash on extremities or on axial skeleton.  Abdomen: Soft nontender  Neuro: Cranial nerves II through XII are intact, neurovascularly intact in all extremities with 2+ DTRs and 2+ pulses.  Lymph: No lymphadenopathy of posterior or anterior cervical chain or axillae bilaterally.  Gait normal with good balance and coordination.  MSK:  Non tender with full range of motion and good stability and symmetric strength and tone of  elbows, wrist, hip, knee and ankles bilaterally.  Shoulder: Right Inspection shows patient does have bruising noted down the arm as well as a significant fluid-filled area on the anterior chest wall and patient does have weakening of the pec tendon Palpation is normal with no tenderness over AC joint or bicipital groove. ROM is full in all planes. Rotator cuff strength normal throughout.  No signs of impingement with negative Neer and Hawkin's tests, empty can sign. Speeds and Yergason's tests normal. No labral pathology noted with negative Obrien's, negative clunk and good stability. Normal scapular function observed. No painful arc and no drop arm sign. No apprehension sign Does have weakness with abduction and pain over the pectoralis muscle   MSK US performed of: Right shoulder This study was ordered, performed, and interpreted by Terrilee Files D.O.  Shoulder:   Supraspinatus:  Appears  normal on long and transverse views, no bursal bulge seen with shoulder abduction on impingement view. Infraspinatus:  Appears normal on long and transverse views. Subscapularis:  Appears normal on long and transverse views. Teres Minor:  Appears normal on long and transverse views. AC joint:  Arthritis Glenohumeral Joint:  Appears normal without effusion. Glenoid Labrum:  Intact without visualized tears. Biceps Tendon:  Appears normal on long and transverse views, no fraying of tendon, tendon located in intertubercular groove, no subluxation with shoulder internal or external rotation. No increased power doppler signal. Patient does have a full-thickness tear of the pec muscle. This seems to have no significant retraction of the tendon at this time. There is significant effusion noted anteriorly. Impression: Pectoralis tendon rupture full-thickness    Impression and Recommendations:     This case required medical decision making of moderate complexity.  Patient does have a right-sided and pectoralis tendon rupture. Patient does not have any significant retraction at this time and we are going to try conservative therapy. Patient does have a hematoma but elected to did not have this drained today. We discussed icing regimen, home exercises, and patient will try and nitroglycerin patches and was given recommendation what to potentially avoid. Patient will start working out on a regular basis but will avoid any pushing or overhead activity. Patient and will come back and see me again in 2 weeks for further evaluation. Discussed with patient if any worsening symptoms such as numbness down the arm or any significant weakness we may need to consider advanced imaging such as an MRI.

## 2015-04-16 NOTE — Patient Instructions (Signed)
Pect tear  Ice 20 minutes 2 times daily. Usually after activity and before bed. Ibuprofen 600mg  3 times a day for 3 days Nitroglycerin Protocol   Apply 1/4 nitroglycerin patch to affected area daily.  Change position of patch within the affected area every 24 hours.  You may experience a headache during the first 1-2 weeks of using the patch, these should subside.  If you experience headaches after beginning nitroglycerin patch treatment, you may take your preferred over the counter pain reliever.  Another side effect of the nitroglycerin patch is skin irritation or rash related to patch adhesive.  Please notify our office if you develop more severe headaches or rash, and stop the patch.  Tendon healing with nitroglycerin patch may require 12 to 24 weeks depending on the extent of injury.  Men should not use if taking Viagra, Cialis, or Levitra.   Do not use if you have migraines or rosacea.  Aovid overhead and pushing.  See me in 2 weeks.

## 2015-04-16 NOTE — Progress Notes (Signed)
Nathaniel Mejia [address] [place], [place] [address] Phone: [phone] Subjective:     CC: right Shoulder.  Nathaniel Mejia is a 64 y.o. male coming in with complaint of right shoulder pain. 2 weeks ago patient was waterskiing and had an extension injury. Patient had significant bruising going down his arm immediately. Patient had a lot of pain. Patient seemed to be getting better and then unfortunately he started doing another activity when he was trying to shift his car hurt an audible pop and had significant pain as well as recurrent bruising. Patient states though that the pain didn't seem to resolve. This was approximately 3 days ago. Patient since then has taken ibuprofen because he is having more of a shoulder pain. Describes it as a dull aching sensation. Denies any numbness down the hand or any weakness. Patient states though that if he tries to do any pushing it is not as strong as his other side. Rates the severity of discomfort as 5 out of 10. Patient does have a vacation and he was to be very active.  Past Medical History  Diagnosis Date  . Arthritis     bilateral hip oa and pain  . Hyperlipidemia    Past Surgical History  Procedure Laterality Date  . Transurethral resection of prostate  2010    Dr Retta Diones  . Appendectomy    . Vasectomy    . Colonoscopy      adenomatous polyp 2012   History  Substance Use Topics  . Smoking status: Current Some Day Smoker    Types: Pipe  . Smokeless tobacco: Not on file  . Alcohol Use: Yes     Comment: rarely   No Known Allergies Family History  Problem Relation Age of Onset  . Cancer Father 20    colon  . Heart failure Mother     valvular heart disease  . COPD Mother        Past medical history, social, surgical and family history all reviewed in electronic medical record.   Review of Systems: No headache, visual changes, nausea, vomiting, diarrhea, constipation, dizziness,  abdominal pain, skin rash, fevers, chills, night sweats, weight loss, swollen lymph nodes, body aches, joint swelling, muscle aches, chest pain, shortness of breath, mood changes.   Objective There were no vitals taken for this visit.  General: No apparent distress alert and oriented x3 mood and affect normal, dressed appropriately.  HEENT: Pupils equal, extraocular movements intact  Respiratory: Patient's speak in full sentences and does not appear short of breath  Cardiovascular: No lower extremity edema, non tender, no erythema  Skin: Warm dry intact with no signs of infection or rash on extremities or on axial skeleton.  Abdomen: Soft nontender  Neuro: Cranial nerves II through XII are intact, neurovascularly intact in all extremities with 2+ DTRs and 2+ pulses.  Lymph: No lymphadenopathy of posterior or anterior cervical chain or axillae bilaterally.  Gait normal with good balance and coordination.  MSK:  Non tender with full range of motion and good stability and symmetric strength and tone of  elbows, wrist, hip, knee and ankles bilaterally.  Shoulder: Right Inspection shows patient does have bruising noted down the arm as well as a significant fluid-filled area on the anterior chest wall and patient does have weakening of the pec tendon Palpation is normal with no tenderness over AC joint or bicipital groove. ROM is full in all planes. Rotator cuff strength normal throughout.  No signs of impingement with negative Neer and Hawkin's tests, empty can sign. Speeds and Yergason's tests normal. No labral pathology noted with negative Obrien's, negative clunk and good stability. Normal scapular function observed. No painful arc and no drop arm sign. No apprehension sign Does have weakness with abduction and pain over the pectoralis muscle   MSK US performed of: Right shoulder This study was ordered, performed, and interpreted by Terrilee Files D.O.  Shoulder:   Supraspinatus:  Appears  normal on long and transverse views, no bursal bulge seen with shoulder abduction on impingement view. Infraspinatus:  Appears normal on long and transverse views. Subscapularis:  Appears normal on long and transverse views. Teres Minor:  Appears normal on long and transverse views. AC joint:  Arthritis Glenohumeral Joint:  Appears normal without effusion. Glenoid Labrum:  Intact without visualized tears. Biceps Tendon:  Appears normal on long and transverse views, no fraying of tendon, tendon located in intertubercular groove, no subluxation with shoulder internal or external rotation. No increased power doppler signal. Patient does have a full-thickness tear of the pec muscle. This seems to have no significant retraction of the tendon at this time. There is significant effusion noted anteriorly.     Impression and Recommendations:     This case required medical decision making of moderate complexity.  Patient does have a right-sided and pectoralis tendon rupture. Patient does not have any significant retraction at this time and we are going to try conservative therapy. Patient does have a hematoma but elected to did not have this drained today. We discussed icing regimen, home exercises, and patient will try and nitroglycerin patches and was given recommendation what to potentially avoid. Patient will start working out on a regular basis but will avoid any pushing or overhead activity. Patient and will come back and see me again in 2 weeks for further evaluation. Discussed with patient if any worsening symptoms such as numbness down the arm or any significant weakness we may need to consider advanced imaging such as an MRI.

## 2015-04-30 ENCOUNTER — Telehealth: Payer: Self-pay | Admitting: *Deleted

## 2015-04-30 MED ORDER — SERTRALINE HCL 100 MG PO TABS: ORAL_TABLET | ORAL | Status: DC

## 2015-04-30 NOTE — Telephone Encounter (Signed)
Patient requesting a refill on Sertraline. Is this ok?

## 2015-04-30 NOTE — Telephone Encounter (Signed)
OK to refill Sertraline.

## 2015-04-30 NOTE — Telephone Encounter (Signed)
Rx refilled.

## 2015-07-24 ENCOUNTER — Encounter: Payer: Self-pay | Admitting: Internal Medicine

## 2015-08-10 ENCOUNTER — Encounter: Payer: Self-pay | Admitting: Internal Medicine

## 2015-08-10 ENCOUNTER — Ambulatory Visit (INDEPENDENT_AMBULATORY_CARE_PROVIDER_SITE_OTHER): Payer: 59 | Admitting: Internal Medicine

## 2015-08-10 VITALS — BP 124/80 | HR 55 | Temp 97.6°F | Resp 16 | Wt 175.0 lb

## 2015-08-10 DIAGNOSIS — Z0000 Encounter for general adult medical examination without abnormal findings: Secondary | ICD-10-CM

## 2015-08-10 DIAGNOSIS — F411 Generalized anxiety disorder: Secondary | ICD-10-CM

## 2015-08-10 MED ORDER — SERTRALINE HCL 100 MG PO TABS
ORAL_TABLET | ORAL | Status: DC
Start: 2015-08-10 — End: 2015-10-03

## 2015-08-10 NOTE — Patient Instructions (Addendum)
The serotonin reuptake inhibitor (Sertraline) should be decreased by half dose if any new medications are started as these could increase the relative dose of the serotonin reuptake inhibitor as they may both be metabolized through the liver.

## 2015-08-10 NOTE — Progress Notes (Signed)
   Subjective:    Patient ID: Nathaniel Mejia, male    DOB: [birth year], 64 y.o.   MRN: [id number]  HPI The patient is here for a physical to assess status of active health conditions.  PMH, FH, & Social History reviewed & updated.No change in FH as recorded.  He is on a heart healthy diet but does eat salt. CVE and weight work encompass 60 minutes 7 days per week w/o cardiopulmonary symptoms.  He has been on Sertraline 150 mg daily with excellent control of anxiety. Recent exogenous stress has impacted his appetite and sleep. He has lost 10 # due to this. He has no other GI symptoms. Colonoscopy will be scheduled.  Complete labs including PSA were done @ Doctor[facility] Day @ [facility].No abnormalities reported. Review of Systems Chest pain, palpitations, tachycardia, exertional dyspnea, paroxysmal nocturnal dyspnea, claudication or edema are absent. No unexplained weight loss, abdominal pain, significant dyspepsia, dysphagia, melena, rectal bleeding, or persistently small caliber stools. Dysuria, pyuria, hematuria, frequency, nocturia or polyuria are denied.He does have urgency. Change in hair, skin, nails denied. No bowel changes of constipation or diarrhea. No intolerance to heat or cold.     Objective:   Physical Exam  General appearance :adequately nourished; in no distress.  Eyes: No conjunctival inflammation or scleral icterus is present.  Oral exam:  Lips and gums are healthy appearing.There is no oropharyngeal erythema or exudate noted. Dental hygiene is good.  Heart:  Normal rate and regular rhythm. S1 and S2 normal without gallop, murmur, click, rub or other extra sounds    Lungs:Chest clear to auscultation; no wheezes, rhonchi,rales ,or rubs present.No increased work of breathing. Pectus variant present with flaring of inferior rib cage bilaterally.  Abdomen: bowel sounds normal, soft and non-tender without masses, organomegaly or hernias noted.  No guarding or rebound. No flank  tenderness to percussion.  Vascular : all pulses equal ; no bruits present.  Skin:Warm & dry.  Intact without suspicious lesions or rashes ; no tenting or jaundice   Lymphatic: No lymphadenopathy is noted about the head, neck, axilla, or inguinal areas. Thickening R anterior axillary area.  Neuro: Strength, tone & DTRs normal.  MS: Decreased cervical spine range of motion.There is slight crepitus of knees. Slightly accentuated thoracic curvature.  GU: varices L scrotum. Minimal asymmetry of prostate w/o nodules.Slight induration on R.     Assessment & Plan:  #1 comprehensive physical exam; no acute findings  Plan: see Orders  & Recommendations

## 2015-08-10 NOTE — Progress Notes (Signed)
Pre visit review using our clinic review tool, if applicable. No additional management support is needed unless otherwise documented below in the visit note. 

## 2015-08-22 ENCOUNTER — Telehealth: Payer: Self-pay | Admitting: Internal Medicine

## 2015-08-22 ENCOUNTER — Other Ambulatory Visit: Payer: Self-pay | Admitting: Emergency Medicine

## 2015-08-22 MED ORDER — ATORVASTATIN CALCIUM 20 MG PO TABS: 20.0000 mg | ORAL_TABLET | Freq: Every day | ORAL | Status: DC

## 2015-08-22 NOTE — Telephone Encounter (Signed)
Request has been sent

## 2015-08-22 NOTE — Telephone Encounter (Signed)
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

## 2015-10-03 ENCOUNTER — Other Ambulatory Visit: Payer: Self-pay | Admitting: Internal Medicine

## 2015-10-24 ENCOUNTER — Telehealth: Payer: Self-pay | Admitting: Internal Medicine

## 2015-10-24 NOTE — Telephone Encounter (Signed)
Patient is requesting a renewal x90 for atorvastatin (LIPITOR) 20 MG tablet [[id number]][[id number]] and sertraline (ZOLOFT) 100 MG tablet [[id number]][[id number]]

## 2015-10-24 NOTE — Telephone Encounter (Signed)
Both OK ;but please verify with pharmacy Sertrlaine not being Rxed by another MD

## 2015-10-26 MED ORDER — SERTRALINE HCL 100 MG PO TABS: 150.0000 mg | ORAL_TABLET | Freq: Every day | ORAL | Status: AC

## 2015-10-26 MED ORDER — ATORVASTATIN CALCIUM 20 MG PO TABS: 20.0000 mg | ORAL_TABLET | Freq: Every day | ORAL | Status: AC

## 2015-10-26 NOTE — Telephone Encounter (Signed)
Refill sent to [facility] 

## 2015-12-04 DIAGNOSIS — H00015 Hordeolum externum left lower eyelid: Secondary | ICD-10-CM | POA: Diagnosis not present

## 2016-01-16 DIAGNOSIS — H0015 Chalazion left lower eyelid: Secondary | ICD-10-CM | POA: Diagnosis not present

## 2016-01-24 DIAGNOSIS — H0015 Chalazion left lower eyelid: Secondary | ICD-10-CM | POA: Diagnosis not present

## 2016-01-25 MED FILL — SERTRALINE HCL 100 MG TAB: 100 | 90 days supply | Qty: 135 | Fill #2
